# Patient Record
Sex: Female | Born: 1950 | Race: White | Hispanic: No | Marital: Married | Smoking: Never smoker
Health system: Southern US, Community
[De-identification: ages and names within clinical notes are randomized; demographics above are authoritative.]

## PROBLEM LIST (undated history)

## (undated) DIAGNOSIS — E039 Hypothyroidism, unspecified: Secondary | ICD-10-CM

## (undated) DIAGNOSIS — L570 Actinic keratosis: Secondary | ICD-10-CM

## (undated) DIAGNOSIS — E079 Disorder of thyroid, unspecified: Secondary | ICD-10-CM

## (undated) HISTORY — PX: TUBAL LIGATION: SHX77

## (undated) HISTORY — DX: Actinic keratosis: L57.0

---

## 2004-07-03 ENCOUNTER — Ambulatory Visit: Payer: Self-pay

## 2004-07-09 ENCOUNTER — Ambulatory Visit: Payer: Self-pay

## 2004-09-22 ENCOUNTER — Ambulatory Visit: Payer: Self-pay

## 2005-09-09 ENCOUNTER — Ambulatory Visit: Payer: Self-pay

## 2006-11-04 ENCOUNTER — Ambulatory Visit: Payer: Self-pay

## 2007-11-22 ENCOUNTER — Ambulatory Visit: Payer: Self-pay | Admitting: Internal Medicine

## 2008-11-22 ENCOUNTER — Ambulatory Visit: Payer: Self-pay | Admitting: Internal Medicine

## 2009-02-27 ENCOUNTER — Ambulatory Visit: Payer: Self-pay | Admitting: Gastroenterology

## 2009-12-10 ENCOUNTER — Ambulatory Visit: Payer: Self-pay | Admitting: Internal Medicine

## 2009-12-17 ENCOUNTER — Ambulatory Visit: Payer: Self-pay | Admitting: Internal Medicine

## 2011-01-02 ENCOUNTER — Ambulatory Visit: Payer: Self-pay | Admitting: Internal Medicine

## 2012-02-17 ENCOUNTER — Ambulatory Visit: Payer: Self-pay

## 2013-02-17 ENCOUNTER — Ambulatory Visit: Payer: Self-pay | Admitting: Internal Medicine

## 2014-03-12 ENCOUNTER — Ambulatory Visit: Payer: Self-pay

## 2014-09-10 ENCOUNTER — Ambulatory Visit: Payer: Self-pay | Admitting: Gastroenterology

## 2015-04-17 ENCOUNTER — Ambulatory Visit
Admission: RE | Admit: 2015-04-17 | Discharge: 2015-04-17 | Disposition: A | Discharge status: Home / Self Care | Payer: TRICARE For Life (TFL) | Source: Ambulatory Visit | Attending: Nurse Practitioner | Admitting: Nurse Practitioner

## 2015-04-17 ENCOUNTER — Other Ambulatory Visit: Payer: Self-pay | Admitting: Nurse Practitioner

## 2015-04-17 DIAGNOSIS — M79644 Pain in right finger(s): Secondary | ICD-10-CM | POA: Insufficient documentation

## 2015-04-17 DIAGNOSIS — Z1231 Encounter for screening mammogram for malignant neoplasm of breast: Secondary | ICD-10-CM

## 2015-04-17 DIAGNOSIS — M79641 Pain in right hand: Secondary | ICD-10-CM | POA: Insufficient documentation

## 2015-04-22 ENCOUNTER — Ambulatory Visit
Admission: RE | Admit: 2015-04-22 | Discharge: 2015-04-22 | Disposition: A | Discharge status: Home / Self Care | Payer: TRICARE For Life (TFL) | Source: Ambulatory Visit | Attending: Nurse Practitioner | Admitting: Nurse Practitioner

## 2015-04-22 DIAGNOSIS — Z1231 Encounter for screening mammogram for malignant neoplasm of breast: Secondary | ICD-10-CM

## 2015-04-30 ENCOUNTER — Emergency Department (HOSPITAL_COMMUNITY): Payer: TRICARE For Life (TFL)

## 2015-04-30 ENCOUNTER — Emergency Department (HOSPITAL_COMMUNITY)
Admission: EM | Admit: 2015-04-30 | Discharge: 2015-04-30 | Disposition: A | Discharge status: Home / Self Care | Payer: TRICARE For Life (TFL) | Attending: Emergency Medicine | Admitting: Emergency Medicine

## 2015-04-30 ENCOUNTER — Encounter (HOSPITAL_COMMUNITY): Payer: Self-pay | Admitting: *Deleted

## 2015-04-30 DIAGNOSIS — Z79899 Other long term (current) drug therapy: Secondary | ICD-10-CM | POA: Insufficient documentation

## 2015-04-30 DIAGNOSIS — Z87891 Personal history of nicotine dependence: Secondary | ICD-10-CM | POA: Insufficient documentation

## 2015-04-30 DIAGNOSIS — E039 Hypothyroidism, unspecified: Secondary | ICD-10-CM | POA: Diagnosis not present

## 2015-04-30 DIAGNOSIS — R42 Dizziness and giddiness: Secondary | ICD-10-CM | POA: Diagnosis present

## 2015-04-30 DIAGNOSIS — H81399 Other peripheral vertigo, unspecified ear: Secondary | ICD-10-CM | POA: Diagnosis not present

## 2015-04-30 DIAGNOSIS — Z880 Allergy status to penicillin: Secondary | ICD-10-CM | POA: Insufficient documentation

## 2015-04-30 HISTORY — DX: Hypothyroidism, unspecified: E03.9

## 2015-04-30 HISTORY — DX: Disorder of thyroid, unspecified: E07.9

## 2015-04-30 LAB — CBC WITH DIFFERENTIAL/PLATELET
BASOS PCT: 0 %
Basophils Absolute: 0 10*3/uL (ref 0.0–0.1)
EOS ABS: 0 10*3/uL (ref 0.0–0.7)
EOS PCT: 0 %
HCT: 39.3 % (ref 36.0–46.0)
Hemoglobin: 13.3 g/dL (ref 12.0–15.0)
LYMPHS ABS: 0.9 10*3/uL (ref 0.7–4.0)
Lymphocytes Relative: 12 %
MCH: 31.3 pg (ref 26.0–34.0)
MCHC: 33.8 g/dL (ref 30.0–36.0)
MCV: 92.5 fL (ref 78.0–100.0)
MONO ABS: 0.3 10*3/uL (ref 0.1–1.0)
Monocytes Relative: 4 %
NEUTROS ABS: 6.4 10*3/uL (ref 1.7–7.7)
NEUTROS PCT: 84 %
Platelets: 200 10*3/uL (ref 150–400)
RBC: 4.25 MIL/uL (ref 3.87–5.11)
RDW: 12.8 % (ref 11.5–15.5)
WBC: 7.6 10*3/uL (ref 4.0–10.5)

## 2015-04-30 LAB — BASIC METABOLIC PANEL
Anion gap: 8 (ref 5–15)
BUN: 14 mg/dL (ref 6–20)
CO2: 25 mmol/L (ref 22–32)
CREATININE: 0.53 mg/dL (ref 0.44–1.00)
Calcium: 9.1 mg/dL (ref 8.9–10.3)
Chloride: 105 mmol/L (ref 101–111)
GFR calc non Af Amer: >60 mL/min (ref >60)
Glucose, Bld: 104 mg/dL — ABNORMAL HIGH (ref 65–99)
Potassium: 4.2 mmol/L (ref 3.5–5.1)
Sodium: 138 mmol/L (ref 135–145)

## 2015-04-30 MED ORDER — SODIUM CHLORIDE 0.9 % IV BOLUS (SEPSIS)
1000.0000 mL | Freq: Once | INTRAVENOUS | Status: AC
  Administered 2015-04-30: 1000 mL via INTRAVENOUS

## 2015-04-30 MED ORDER — MECLIZINE HCL 25 MG PO TABS: 25.0000 mg | ORAL_TABLET | Freq: Three times a day (TID) | ORAL | Status: DC | PRN

## 2015-04-30 MED ORDER — LORAZEPAM 2 MG/ML IJ SOLN
1.0000 mg | Freq: Once | INTRAMUSCULAR | Status: AC
  Administered 2015-04-30: 1 mg via INTRAVENOUS
  Filled 2015-04-30: qty 1

## 2015-04-30 NOTE — ED Notes (Signed)
Patient is claustrophobic and doesn't even get in elevators because of that.  Discussed with EDP [place].  Will give patient 1 mg of Ativan as she departs for MRI.

## 2015-04-30 NOTE — ED Notes (Signed)
Per EMS - patient comes from home where she lives with her husband with c/o dizziness.  Patient got up to take a shower this morning shortly after 4 am and became so dizzy she had to lay back down.  Patient denies syncope.  Patient states movement of head from side to side makes dizziness worse.  Patient also c/o nausea.  [place] negative.  12 lead unremarkable.  Patient denies hx of vertigo and her only medical issue is hypothyroidism.  Patient received Zofran from EMS and that relieved nausea.  Vitals on scene, 128/70, HR 70, 100% on RA.  CBG 137.

## 2015-04-30 NOTE — ED Notes (Signed)
DIZZINESS  Feeling dizzy since last night Dizziness is new and worse with movement of head Feels like room spins: yes Lightheadedness when stands: no Palpitations or heart racing: no Chest pain:  no Prior dizziness: no Medications tried: no Taking blood thinners: no  Symptoms Hearing Loss: no Ear Pain or fullness: no Ear ringing:  no Nausea or vomiting: yes Vision difficulty or double vision: no Falls: no Head trauma: no Weakness in arm or leg: no Speaking problems: no Headache: intermittent  Patient presents to the ED from home where she lives with her husband with complaints of dizziness since @ 10 pm last night.  Patient states moving her head makes dizziness worse and being still makes it better.  Patient had worked out at [employer] last night and shortly thereafter the dizziness began.  Patient denies headache, but states she has a sense of heaviness in her head.  Patient denies history of vertigo.  On exam, patients lung sounds are clear to auscultation in all lobes with no wheezing or crackles.  Heart sounds WNL, S1/S2 no rub, murmur or gallop.  +3 radial and pedal pulses.  No pre-tibial or pedal edema.  CNIII intact, 64mm, EOMI, no astigmatism.  Upper and lower extremity strength equal bilaterally, no leg or arm drift, face/smile symmetrical, no dysmetria.  Skin warm and dry.

## 2015-04-30 NOTE — ED Provider Notes (Signed)
CSN: [id number]     Arrival date & time 04/30/15  7096 History   First MD Initiated Contact with Patient 04/30/15 [phone]     Chief Complaint  Patient presents with  . Dizziness     (Consider location/radiation/quality/duration/timing/severity/associated sxs/prior Treatment) HPI  64 year old female presents with dizziness and a room spinning sensation that started last night around 10:30 PM. Patient has never had this sensation before. Patient had trouble last night it was unable to sleep due to feeling the sensation whenever she turned her head. There was no sensation of falling one way or the other. It did feel like she is almost about to pass out. There is a heaviness to her head but denies specific headache. No prior history of stroke or vertigo. Besides hypothyroidism has no other medical problems. Patient states she was given Zofran by EMS after her symptoms continued into this morning but now feels completely resolved. The patient denies fevers, diarrhea, or other preceding illness. There is no blurry vision. No ear ringing or pain. No weakness, numbness, or slurred speech.  Past Medical History  Diagnosis Date  . Thyroid disease   . Hypothyroid    Past Surgical History  Procedure Laterality Date  . Tubal ligation     No family history on file. Social History  Substance Use Topics  . Smoking status: Former Smoker    Quit date: 04/29/1972  . Smokeless tobacco: Never Used  . Alcohol Use: No   OB History    No data available     Review of Systems  Constitutional: Negative for fever.  HENT: Negative for ear pain and tinnitus.   Eyes: Negative for visual disturbance.  Gastrointestinal: Positive for nausea and vomiting.  Neurological: Positive for dizziness and headaches. Negative for speech difficulty, weakness and numbness.  All other systems reviewed and are negative.     Allergies  Amoxicillin; Tetracyclines & related; Nubain; and Tetracycline  Home Medications    Prior to Admission medications   Medication Sig Start Date End Date Taking? Authorizing Provider  acetaminophen (TYLENOL) 160 MG/5ML solution Take 240 mg by mouth once.   Yes Historical Provider, MD  Biotin 1 MG CAPS Take 1 tablet by mouth daily.   Yes Historical Provider, MD  calcium citrate-vitamin D (CITRACAL+D) 315-200 MG-UNIT per tablet Take 1 tablet by mouth daily.   Yes Historical Provider, MD  Cyanocobalamin (B-12) 250 MCG TABS Take 250 mcg by mouth daily.   Yes Historical Provider, MD  doxycycline (ORACEA) 40 MG capsule Take 40 mg by mouth every morning.   Yes Historical Provider, MD  folic acid (FOLVITE) 629 MCG tablet Take 1 tablet by mouth daily.   Yes Historical Provider, MD  levothyroxine (SYNTHROID, LEVOTHROID) 50 MCG tablet Take 50 mcg by mouth daily before breakfast.   Yes Historical Provider, MD  Multiple Vitamin (MULTIVITAMIN) tablet Take 1 tablet by mouth daily.   Yes Historical Provider, MD  vitamin C (ASCORBIC ACID) 500 MG tablet Take 500 mg by mouth daily.   Yes Historical Provider, MD   BP 128/72 mmHg  Pulse 61  Temp(Src) 97.7 F (36.5 C) (Oral)  SpO2 98% Physical Exam  Constitutional: She is oriented to person, place, and time. She appears well-developed and well-nourished.  HENT:  Head: Normocephalic and atraumatic.  Right Ear: Tympanic membrane and external ear normal.  Left Ear: Tympanic membrane and external ear normal.  Nose: Nose normal.  Eyes: EOM are normal. Pupils are equal, round, and reactive to light. Right eye exhibits  no discharge. Left eye exhibits no discharge.  Slight nystagmus  Neck: Neck supple.  Cardiovascular: Normal rate, regular rhythm and normal heart sounds.   Pulmonary/Chest: Effort normal and breath sounds normal.  Abdominal: Soft. There is no tenderness.  Neurological: She is alert and oriented to person, place, and time.  CN 2-12 grossly intact. 5/5 strength in all 4 extremities. Normal finger to nose. Normal gait  Skin: Skin  is warm and dry.  Nursing note and vitals reviewed.   ED Course  Procedures (including critical care time) Labs Review Labs Reviewed  BASIC METABOLIC PANEL - Abnormal; Notable for the following:    Glucose, Bld 104 (*)    All other components within normal limits  CBC WITH DIFFERENTIAL/PLATELET    Imaging Review Ct Head Wo Contrast  04/30/2015   CLINICAL DATA:  64 year old female dizziness since 2200 hours yesterday. Initial encounter.  EXAM: CT HEAD WITHOUT CONTRAST  TECHNIQUE: Contiguous axial images were obtained from the base of the skull through the vertex without intravenous contrast.  COMPARISON:  No prior head imaging.  FINDINGS: Visualized paranasal sinuses and mastoids are clear. No acute osseous abnormality identified. Visualized orbits and scalp soft tissues are within normal limits. Bilateral tympanic cavities appear clear.  Cerebral volume is within normal limits for age. No midline shift, mass effect, or evidence of intracranial mass lesion. No ventriculomegaly. No suspicious intracranial vascular hyperdensity. No cortically based acute infarct identified. Mildly asymmetric punctate hyperdensity at the left basal ganglia most likely is inconsequential dystrophic calcification (series 2, image 13). Normal gray-white matter differentiation elsewhere. No acute intracranial hemorrhage identified.  IMPRESSION: Normal for age noncontrast CT appearance of the brain.   Electronically Signed   By: Genevie Ann M.D.   On: 04/30/2015 09:27   Mr Brain Wo Contrast (neuro Protocol)  04/30/2015   CLINICAL DATA:  Vertigo/dizziness beginning last night. Symptoms began shortly after exercising.  EXAM: MRI HEAD WITHOUT CONTRAST  TECHNIQUE: Multiplanar, multiecho pulse sequences of the brain and surrounding structures were obtained without intravenous contrast.  COMPARISON:  Head CT 04/30/2015  FINDINGS: There is no evidence of acute infarct, intracranial hemorrhage, mass, midline shift, or extra-axial  fluid collection. Ventricles and sulci are within normal limits for age. No significant white matter disease is seen.  Orbits are unremarkable. There is mild bilateral maxillary sinus and mild anterior left ethmoid air cell mucosal thickening. Mastoid air cells are clear. Major intracranial vascular flow voids are preserved.  IMPRESSION: Unremarkable appearance of the brain for age.   Electronically Signed   By: Logan Bores M.D.   On: 04/30/2015 11:40   I have personally reviewed and evaluated these images and lab results as part of my medical decision-making.   EKG Interpretation   Date/Time:  Tuesday April 30 2015 08:23:49 EDT Ventricular Rate:  57 PR Interval:  181 QRS Duration: 86 QT Interval:  438 QTC Calculation: 426 R Axis:   59 Text Interpretation:  Normal sinus rhythm Left atrial enlargement Probable  anteroseptal infarct, old Baseline wander in lead(s) II III aVF no acute  ST/T changes No old tracing to compare Confirmed by GOLDSTON  MD, SCOTT  ([id number]) on 04/30/2015 8:42:16 AM      MDM   Final diagnoses:  Peripheral vertigo, unspecified laterality    Patient's vertigo is most likely peripheral in nature. However given her age and new onset at age 89 with significant symptoms and MRI was obtained to rule out stroke. This is negative. No obvious mass. She is  able to walk without getting dizzy or feeling symptoms of falling. At this point, will give a prescription of Antivert if her symptoms return and recommend follow-up with PCP. Discussed strict return per cautions.    Sherwood Gambler, MD 04/30/15 1540

## 2015-04-30 NOTE — ED Notes (Signed)
Patient transported to MRI 

## 2015-04-30 NOTE — ED Notes (Signed)
Patient ambulated without assistance in room and was not dizzy.

## 2016-04-21 ENCOUNTER — Other Ambulatory Visit: Payer: Self-pay | Admitting: Nurse Practitioner

## 2016-04-21 DIAGNOSIS — E2839 Other primary ovarian failure: Secondary | ICD-10-CM

## 2016-04-21 DIAGNOSIS — Z1231 Encounter for screening mammogram for malignant neoplasm of breast: Secondary | ICD-10-CM

## 2016-05-19 ENCOUNTER — Other Ambulatory Visit: Payer: Self-pay | Admitting: Nurse Practitioner

## 2016-05-19 ENCOUNTER — Ambulatory Visit
Admission: RE | Admit: 2016-05-19 | Discharge: 2016-05-19 | Disposition: A | Discharge status: Home / Self Care | Payer: TRICARE For Life (TFL) | Source: Ambulatory Visit | Attending: Nurse Practitioner | Admitting: Nurse Practitioner

## 2016-05-19 DIAGNOSIS — E063 Autoimmune thyroiditis: Secondary | ICD-10-CM | POA: Diagnosis not present

## 2016-05-19 DIAGNOSIS — Z780 Asymptomatic menopausal state: Secondary | ICD-10-CM | POA: Diagnosis not present

## 2016-05-19 DIAGNOSIS — Z1231 Encounter for screening mammogram for malignant neoplasm of breast: Secondary | ICD-10-CM

## 2016-05-19 DIAGNOSIS — E2839 Other primary ovarian failure: Secondary | ICD-10-CM | POA: Insufficient documentation

## 2016-06-15 ENCOUNTER — Ambulatory Visit (INDEPENDENT_AMBULATORY_CARE_PROVIDER_SITE_OTHER): Payer: TRICARE For Life (TFL) | Admitting: Urology

## 2016-06-15 ENCOUNTER — Encounter: Payer: Self-pay | Admitting: Urology

## 2016-06-15 VITALS — BP 127/79 | HR 63 | Ht 65.5 in | Wt 142.1 lb

## 2016-06-15 DIAGNOSIS — N8111 Cystocele, midline: Secondary | ICD-10-CM | POA: Diagnosis not present

## 2016-06-15 DIAGNOSIS — N8110 Cystocele, unspecified: Secondary | ICD-10-CM

## 2016-06-15 DIAGNOSIS — N816 Rectocele: Secondary | ICD-10-CM

## 2016-06-15 LAB — BLADDER SCAN AMB NON-IMAGING: Scan Result: 24

## 2016-06-15 NOTE — Progress Notes (Signed)
06/15/2016 1:56 PM   Stephanie Dillon 09/16/1950 [id number]  Referring provider: Ronnell Freshwater, NP [place], [place] [address]  Chief Complaint  Patient presents with  . New Patient (Initial Visit)    bladder prolapsed     HPI: The patient for a number of years has worsening vaginal bulging sensation. Sometimes she reduces the bladder to void better. Her flow was good. She is continent.  She voids every 3 hours and gets up twice at night.  She has not had a hysterectomy and lives in [place]  She has no neurologic issues. Her bowel movements are normal. She has not had previous bladder surgery. She does not get urinary tract infections commonly. She has not had a kidney stone  She finds that the symptoms are less that she drinks a lot of fluids but worse after she eats. Having said this I did not think she was aggravating the rectocele. She is sexually active  Modifying factors: There are no other modifying factors  Associated signs and symptoms: There are no other associated signs and symptoms Aggravating and relieving factors: There are no other aggravating or relieving factors Severity: Moderate Duration: Persistent   PMH: Past Medical History:  Diagnosis Date  . Hypothyroid   . Thyroid disease     Surgical History: Past Surgical History:  Procedure Laterality Date  . TUBAL LIGATION      Home Medications:    Medication List       Accurate as of 06/15/16  1:56 PM. Always use your most recent med list.          acetaminophen 160 MG/5ML solution Commonly known as:  TYLENOL Take 240 mg by mouth once.   B-12 250 MCG Tabs Take 250 mcg by mouth daily.   Biotin 1 MG Caps Take 1 tablet by mouth daily.   calcium citrate-vitamin D 315-200 MG-UNIT tablet Commonly known as:  CITRACAL+D Take 1 tablet by mouth daily.   folic acid A999333 MCG tablet Commonly known as:  FOLVITE Take 1 tablet by mouth daily.   levothyroxine 50 MCG tablet Commonly  known as:  SYNTHROID, LEVOTHROID Take 50 mcg by mouth daily before breakfast.   meclizine 25 MG tablet Commonly known as:  ANTIVERT Take 1 tablet (25 mg total) by mouth 3 (three) times daily as needed for dizziness.   multivitamin tablet Take 1 tablet by mouth daily.   vitamin C 500 MG tablet Commonly known as:  ASCORBIC ACID Take 500 mg by mouth daily.       Allergies:  Allergies  Allergen Reactions  . Amoxicillin Hives and Rash    Has patient had a PCN reaction causing immediate rash, facial/tongue/throat swelling, SOB or lightheadedness with hypotension: No Has patient had a PCN reaction causing severe rash involving mucus membranes or skin necrosis: No Has patient had a PCN reaction that required hospitalization No Has patient had a PCN reaction occurring within the last 10 years: Yes If all of the above answers are "NO", then may proceed with Cephalosporin use.   . Nalbuphine Hives  . Tetracyclines & Related Nausea And Vomiting  . Nubain [Nalbuphine Hcl]     fever  . Tetracycline Nausea And Vomiting    Family History: Family History  Problem Relation Age of Onset  . Colon cancer Mother   . AAA (abdominal aortic aneurysm) Mother     Social History:  reports that she quit smoking about 44 years ago. She has never used smokeless tobacco. She  reports that she does not drink alcohol or use drugs.  ROS:                                        Physical Exam: BP 127/79   Pulse 63   Ht 5' 5.5" (1.664 m)   Wt 142 lb 1.6 oz (64.5 kg)   BMI 23.29 kg/m   Constitutional:  Alert and oriented, No acute distress. HEENT: [place] AT, moist mucus membranes.  Trachea midline, no masses. Cardiovascular: No clubbing, cyanosis, or edema. Respiratory: Normal respiratory effort, no increased work of breathing. GI: Abdomen is soft, nontender, nondistended, no abdominal masses GU: On Pelvic examination patient had a moderate grade 2 cystocele. Her uterus wouldn't  distend approximate 2 cm. She had a smaller grade 2 rectocele. Unfortunately she could not bear down or cough hard. She had no stress incontinence Skin: No rashes, bruises or suspicious lesions. Lymph: No cervical or inguinal adenopathy. Neurologic: Grossly intact, no focal deficits, moving all 4 extremities. Psychiatric: Normal mood and affect.  Laboratory Data: Lab Results  Component Value Date   WBC 7.6 04/30/2015   HGB 13.3 04/30/2015   HCT 39.3 04/30/2015   MCV 92.5 04/30/2015   PLT 200 04/30/2015    Lab Results  Component Value Date   CREATININE 0.53 04/30/2015    No results found for: PSA  No results found for: TESTOSTERONE  No results found for: HGBA1C  Urinalysis No results found for: COLORURINE, APPEARANCEUR, LABSPEC, PHURINE, GLUCOSEU, HGBUR, BILIRUBINUR, KETONESUR, PROTEINUR, UROBILINOGEN, NITRITE, LEUKOCYTESUR  Pertinent Imaging: None  Assessment & Plan:  Patient has prolapse symptoms and minimal voiding dysfunction. She has mild or nocturia. A picture was drawn. The role of urodynamics was discussed.   If the patient had surgery she would likely best benefit from a hysterectomy with cystocele repair. She may or may not need a vault suspension but likely fixation of the cuff to the ureteral sacral ligaments. She may benefit from a rectocele repair but an intraoperative decision would need to be made. Unfortunately it was harder for her to cough hard today.   1. Cystocele 2. Rectocele 3. Nighttime frequency  There are no diagnoses linked to this encounter.  Return in about 4 weeks (around 07/13/2016) for UDS [place].  Reece Packer, MD  [facility] [address] [place],  [address] [phone]

## 2016-08-04 ENCOUNTER — Ambulatory Visit: Payer: TRICARE For Life (TFL)

## 2016-08-26 ENCOUNTER — Ambulatory Visit: Payer: TRICARE For Life (TFL)

## 2016-10-19 ENCOUNTER — Ambulatory Visit: Payer: TRICARE For Life (TFL)

## 2017-01-14 IMAGING — CR DG FINGER MIDDLE 2+V*R*
1 series · 3 of 3 positions shown · non-contrast
Comparison: None.

CLINICAL DATA: Pain in right middle finger. Started 2-3 months ago
after mowing grass.

EXAM:
RIGHT MIDDLE FINGER 2+V

[Series 1: dg finger middle right · 0.14mm/px · 3 of 3 slices shown]
[im 1/3]
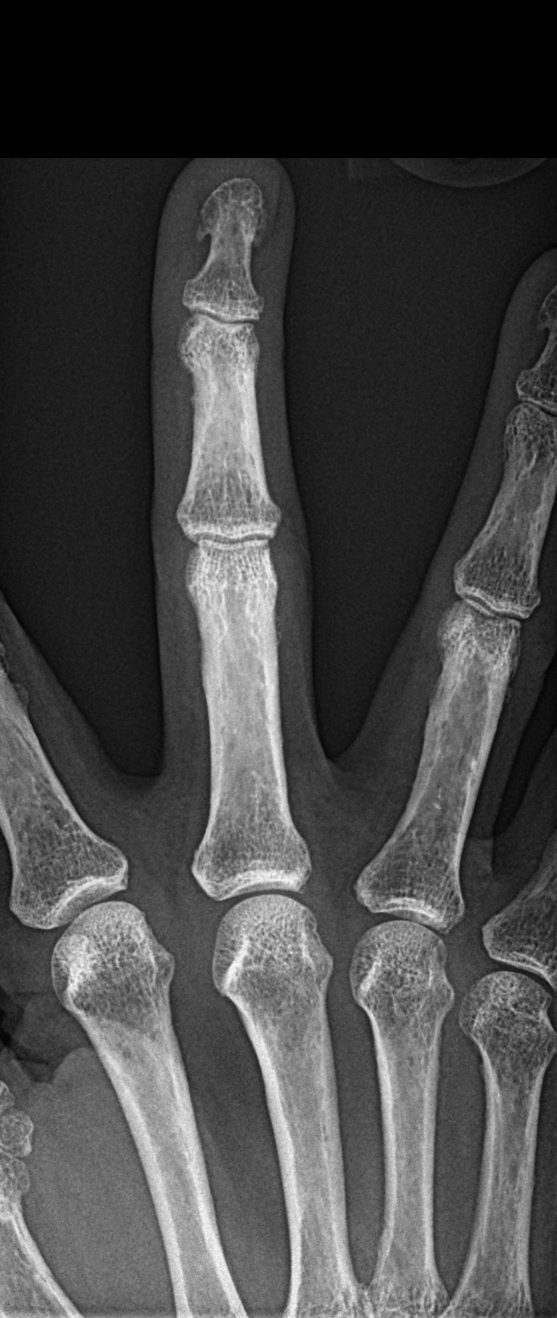
[im 2/3]
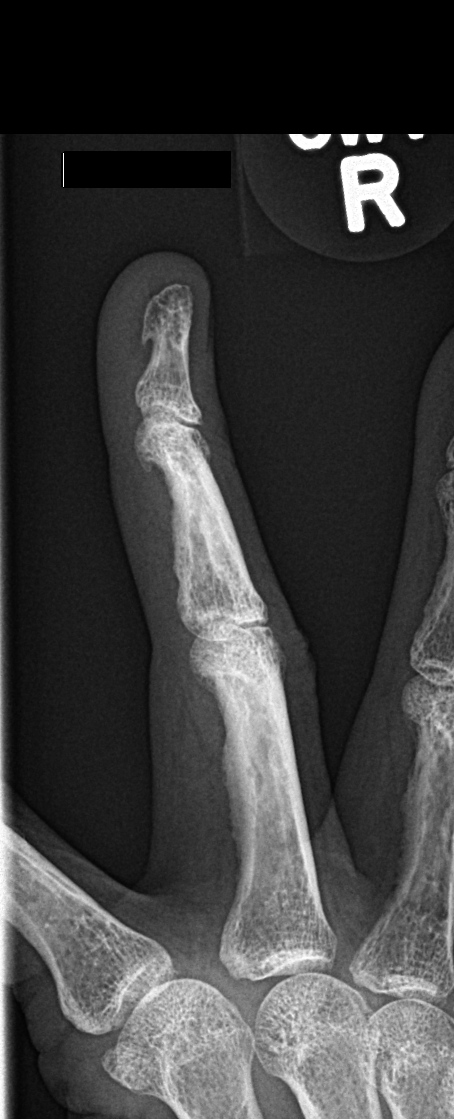
[im 3/3]
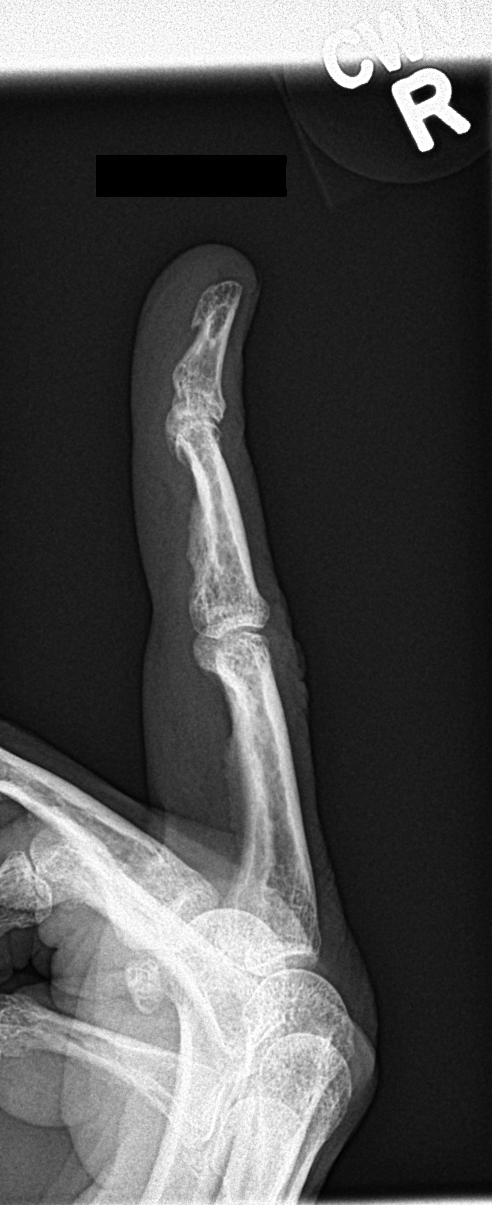

[3 of 3 positions shown; findings below may reference images not displayed]

FINDINGS: Early joint space narrowing and spurring in the DIP joint. Remainder
of the visualized joints are maintained. No acute bony abnormality.
Specifically, no fracture, subluxation, or dislocation. Soft tissues
are intact.
IMPRESSION: No acute bony abnormality.

## 2017-03-30 DIAGNOSIS — H2513 Age-related nuclear cataract, bilateral: Secondary | ICD-10-CM | POA: Diagnosis not present

## 2017-04-27 DIAGNOSIS — N390 Urinary tract infection, site not specified: Secondary | ICD-10-CM | POA: Diagnosis not present

## 2017-04-27 DIAGNOSIS — E039 Hypothyroidism, unspecified: Secondary | ICD-10-CM | POA: Diagnosis not present

## 2017-04-27 DIAGNOSIS — N8110 Cystocele, unspecified: Secondary | ICD-10-CM | POA: Diagnosis not present

## 2017-04-27 DIAGNOSIS — Z0001 Encounter for general adult medical examination with abnormal findings: Secondary | ICD-10-CM | POA: Diagnosis not present

## 2017-04-30 ENCOUNTER — Other Ambulatory Visit: Payer: Self-pay | Admitting: Nurse Practitioner

## 2017-04-30 DIAGNOSIS — Z1231 Encounter for screening mammogram for malignant neoplasm of breast: Secondary | ICD-10-CM

## 2017-05-13 DIAGNOSIS — E063 Autoimmune thyroiditis: Secondary | ICD-10-CM | POA: Diagnosis not present

## 2017-05-26 ENCOUNTER — Ambulatory Visit
Admission: RE | Admit: 2017-05-26 | Discharge: 2017-05-26 | Disposition: A | Discharge status: Home / Self Care | Payer: Medicare Other | Source: Ambulatory Visit | Attending: Nurse Practitioner | Admitting: Nurse Practitioner

## 2017-05-26 DIAGNOSIS — Z1231 Encounter for screening mammogram for malignant neoplasm of breast: Secondary | ICD-10-CM | POA: Diagnosis not present

## 2017-06-07 DIAGNOSIS — E559 Vitamin D deficiency, unspecified: Secondary | ICD-10-CM | POA: Diagnosis not present

## 2017-06-07 DIAGNOSIS — Z0000 Encounter for general adult medical examination without abnormal findings: Secondary | ICD-10-CM | POA: Diagnosis not present

## 2017-06-07 DIAGNOSIS — L570 Actinic keratosis: Secondary | ICD-10-CM | POA: Diagnosis not present

## 2017-06-07 DIAGNOSIS — Z1283 Encounter for screening for malignant neoplasm of skin: Secondary | ICD-10-CM | POA: Diagnosis not present

## 2017-06-07 DIAGNOSIS — L821 Other seborrheic keratosis: Secondary | ICD-10-CM | POA: Diagnosis not present

## 2017-06-07 DIAGNOSIS — T07XXXA Unspecified multiple injuries, initial encounter: Secondary | ICD-10-CM | POA: Diagnosis not present

## 2017-06-07 DIAGNOSIS — D229 Melanocytic nevi, unspecified: Secondary | ICD-10-CM | POA: Diagnosis not present

## 2017-06-07 DIAGNOSIS — I10 Essential (primary) hypertension: Secondary | ICD-10-CM | POA: Diagnosis not present

## 2017-06-07 DIAGNOSIS — L814 Other melanin hyperpigmentation: Secondary | ICD-10-CM | POA: Diagnosis not present

## 2017-06-07 DIAGNOSIS — L578 Other skin changes due to chronic exposure to nonionizing radiation: Secondary | ICD-10-CM | POA: Diagnosis not present

## 2017-06-07 DIAGNOSIS — E039 Hypothyroidism, unspecified: Secondary | ICD-10-CM | POA: Diagnosis not present

## 2017-06-07 DIAGNOSIS — D1800 Hemangioma unspecified site: Secondary | ICD-10-CM | POA: Diagnosis not present

## 2017-06-07 DIAGNOSIS — I8391 Asymptomatic varicose veins of right lower extremity: Secondary | ICD-10-CM | POA: Diagnosis not present

## 2017-06-07 DIAGNOSIS — L918 Other hypertrophic disorders of the skin: Secondary | ICD-10-CM | POA: Diagnosis not present

## 2017-06-16 DIAGNOSIS — N8110 Cystocele, unspecified: Secondary | ICD-10-CM | POA: Diagnosis not present

## 2018-03-30 DIAGNOSIS — H5203 Hypermetropia, bilateral: Secondary | ICD-10-CM | POA: Diagnosis not present

## 2018-03-30 DIAGNOSIS — H25013 Cortical age-related cataract, bilateral: Secondary | ICD-10-CM | POA: Diagnosis not present

## 2018-03-30 DIAGNOSIS — H52223 Regular astigmatism, bilateral: Secondary | ICD-10-CM | POA: Diagnosis not present

## 2018-03-30 DIAGNOSIS — H59811 Chorioretinal scars after surgery for detachment, right eye: Secondary | ICD-10-CM | POA: Diagnosis not present

## 2018-03-30 DIAGNOSIS — H2513 Age-related nuclear cataract, bilateral: Secondary | ICD-10-CM | POA: Diagnosis not present

## 2018-03-30 DIAGNOSIS — H43813 Vitreous degeneration, bilateral: Secondary | ICD-10-CM | POA: Diagnosis not present

## 2018-03-30 DIAGNOSIS — H524 Presbyopia: Secondary | ICD-10-CM | POA: Diagnosis not present

## 2018-05-02 ENCOUNTER — Ambulatory Visit (INDEPENDENT_AMBULATORY_CARE_PROVIDER_SITE_OTHER): Payer: Medicare Other | Admitting: Nurse Practitioner

## 2018-05-02 ENCOUNTER — Encounter: Payer: Self-pay | Admitting: Nurse Practitioner

## 2018-05-02 VITALS — BP 126/82 | HR 71 | Resp 16 | Ht 70.0 in | Wt 141.8 lb

## 2018-05-02 DIAGNOSIS — Z1231 Encounter for screening mammogram for malignant neoplasm of breast: Secondary | ICD-10-CM | POA: Diagnosis not present

## 2018-05-02 DIAGNOSIS — Z1239 Encounter for other screening for malignant neoplasm of breast: Secondary | ICD-10-CM

## 2018-05-02 DIAGNOSIS — E559 Vitamin D deficiency, unspecified: Secondary | ICD-10-CM | POA: Diagnosis not present

## 2018-05-02 DIAGNOSIS — R300 Dysuria: Secondary | ICD-10-CM | POA: Diagnosis not present

## 2018-05-02 DIAGNOSIS — N8110 Cystocele, unspecified: Secondary | ICD-10-CM | POA: Diagnosis not present

## 2018-05-02 DIAGNOSIS — E2839 Other primary ovarian failure: Secondary | ICD-10-CM

## 2018-05-02 DIAGNOSIS — Z0001 Encounter for general adult medical examination with abnormal findings: Secondary | ICD-10-CM | POA: Diagnosis not present

## 2018-05-02 NOTE — Progress Notes (Signed)
[facility] [place], [place] [address]  Internal MEDICINE  Office Visit Note  Patient Name: Stephanie Dillon  [id number]  [id number]  Date of Service: 05/02/2018   Pt is here for routine health maintenance examination   Chief Complaint  Patient presents with  . Annual Exam    medicare annual wellness      Patient continues to have concern about prolapsed bladder. Did see urogynecologist last year. Did some at home, pelvic exercises, to help strengthen pelvic muscles. Has not noted any improvement. Thought that follow up was to be in one year, but it was 6 months. She is unsure if she needs new referral to go back.  She, otherwise feels good with no problems, concerns, or complaints.    Current Medication: Outpatient Encounter Medications as of 05/02/2018  Medication Sig  . Biotin 1 MG CAPS Take 1 tablet by mouth daily.  . calcium citrate-vitamin D (CITRACAL+D) 315-200 MG-UNIT per tablet Take 1 tablet by mouth daily.  . Cyanocobalamin (B-12) 250 MCG TABS Take 250 mcg by mouth daily.  . folic acid (FOLVITE) 712 MCG tablet Take 1 tablet by mouth daily.  [place] Kitchen levothyroxine (SYNTHROID, LEVOTHROID) 50 MCG tablet Take 50 mcg by mouth daily before breakfast.  . Multiple Vitamin (MULTIVITAMIN) tablet Take 1 tablet by mouth daily.  . vitamin C (ASCORBIC ACID) 500 MG tablet Take 500 mg by mouth daily.  . [DISCONTINUED] acetaminophen (TYLENOL) 160 MG/5ML solution Take 240 mg by mouth once.  . [DISCONTINUED] meclizine (ANTIVERT) 25 MG tablet Take 1 tablet (25 mg total) by mouth 3 (three) times daily as needed for dizziness. (Patient not taking: Reported on 06/15/2016)   No facility-administered encounter medications on file as of 05/02/2018.     Surgical History: Past Surgical History:  Procedure Laterality Date  . TUBAL LIGATION      Medical History: Past Medical History:  Diagnosis Date  . Hypothyroid   . Thyroid disease     Family History: Family History   Problem Relation Age of Onset  . Colon cancer Mother   . AAA (abdominal aortic aneurysm) Mother   . Breast cancer Neg Hx       Review of Systems  Constitutional: Negative for activity change, chills, fatigue and unexpected weight change.  HENT: Negative for congestion, postnasal drip, rhinorrhea, sneezing and sore throat.   Eyes: Negative.  Negative for redness.  Respiratory: Negative for cough, chest tightness and shortness of breath.   Cardiovascular: Negative for chest pain and palpitations.  Gastrointestinal: Negative for abdominal pain, constipation, diarrhea, nausea and vomiting.  Endocrine: Negative for cold intolerance, heat intolerance, polydipsia, polyphagia and polyuria.       Patient has chronic hypothyroid which is managed per endocrinology.   Genitourinary: Negative for dysuria and frequency.       Chronic bladder prolapse.   Musculoskeletal: Negative for arthralgias, back pain, joint swelling and neck pain.  Skin: Negative for rash.  Allergic/Immunologic: Negative for environmental allergies.  Neurological: Negative for dizziness, tremors, numbness and headaches.  Hematological: Negative for adenopathy. Does not bruise/bleed easily.  Psychiatric/Behavioral: Negative for behavioral problems (Depression), sleep disturbance and suicidal ideas. The patient is not nervous/anxious.      Today's Vitals   05/02/18 0857  BP: 126/82  Pulse: 71  Resp: 16  SpO2: 98%  Weight: 141 lb 12.8 oz (64.3 kg)  Height: 5\' 10"  (1.778 m)    Physical Exam  Constitutional: She is oriented to person, place, and time. She appears well-developed  and well-nourished. No distress.  HENT:  Head: Normocephalic and atraumatic.  Nose: Nose normal.  Mouth/Throat: Oropharynx is clear and moist. No oropharyngeal exudate.  Eyes: Pupils are equal, round, and reactive to light. Conjunctivae and EOM are normal.  Neck: Normal range of motion. Neck supple. No JVD present. Carotid bruit is not  present. No tracheal deviation present. No thyromegaly present.  Cardiovascular: Normal rate, regular rhythm, normal heart sounds and intact distal pulses. Exam reveals no gallop and no friction rub.  No murmur heard. Pulmonary/Chest: Effort normal and breath sounds normal. No respiratory distress. She has no wheezes. She has no rales. She exhibits no tenderness. Right breast exhibits no inverted nipple, no mass, no nipple discharge, no skin change and no tenderness. Left breast exhibits no inverted nipple, no mass, no nipple discharge, no skin change and no tenderness.  Abdominal: Soft. Bowel sounds are normal. There is no tenderness.  Musculoskeletal: Normal range of motion.  Lymphadenopathy:    She has no cervical adenopathy.  Neurological: She is alert and oriented to person, place, and time. No cranial nerve deficit.  Skin: Skin is warm and dry. Capillary refill takes less than 2 seconds. She is not diaphoretic.  Psychiatric: She has a normal mood and affect. Her behavior is normal. Judgment and thought content normal.  Nursing note and vitals reviewed.  Depression screen PHQ 2/9 05/02/2018  Decreased Interest 0  Down, Depressed, Hopeless 0  PHQ - 2 Score 0    Functional Status Survey: Is the patient deaf or have difficulty hearing?: No Does the patient have difficulty seeing, even when wearing glasses/contacts?: No Does the patient have difficulty concentrating, remembering, or making decisions?: No Does the patient have difficulty walking or climbing stairs?: No Does the patient have difficulty dressing or bathing?: No Does the patient have difficulty doing errands alone such as visiting a doctor's office or shopping?: No  MMSE - [place] Exam 05/02/2018  Orientation to time 5  Orientation to Place 5  Registration 3  Attention/ Calculation 5  Recall 3  Language- name 2 objects 2  Language- repeat 1  Language- follow 3 step command 3  Language- read & follow direction  1  Write a sentence 1  Copy design 1  Total score 30    Fall Risk  05/02/2018  Falls in the past year? No   Assessment/Plan: 1. Encounter for general adult medical examination with abnormal findings Annual health maintenance exam today. - Comprehensive metabolic panel - Lipid panel - CBC, No Differential/Platelet  2. Bladder prolapse, female, acquired New referral for urogynecology for further evaluation and treatment.  - Ambulatory referral to Gynecology  3. Vitamin D deficiency - Vitamin D 1,25 dihydroxy  4. Ovarian failure Bone density test ordered today.  - DG Bone Density; Future  5. Screening for breast cancer - MM DIGITAL SCREENING BILATERAL; Future  6. Dysuria - UA/M w/rflx Culture, Routine  General Counseling: Vieva verbalizes understanding of the findings of todays visit and agrees with plan of treatment. I have discussed any further diagnostic evaluation that may be needed or ordered today. We also reviewed her medications today. she has been encouraged to call the office with any questions or concerns that should arise related to todays visit.    Counseling:  This patient was seen by Leretha Pol FNP Collaboration with Dr Lavera Guise as a part of collaborative care agreement  Orders Placed This Encounter  Procedures  . MM DIGITAL SCREENING BILATERAL  . DG Bone  Density  . UA/M w/rflx Culture, Routine  . Comprehensive metabolic panel  . Lipid panel  . Vitamin D 1,25 dihydroxy  . CBC, No Differential/Platelet  . Ambulatory referral to Gynecology     Time spent: [place], MD  Internal Medicine

## 2018-05-03 ENCOUNTER — Other Ambulatory Visit: Payer: Self-pay | Admitting: Nurse Practitioner

## 2018-05-03 DIAGNOSIS — Z1231 Encounter for screening mammogram for malignant neoplasm of breast: Secondary | ICD-10-CM

## 2018-05-06 LAB — MICROSCOPIC EXAMINATION: Casts: NONE SEEN /lpf

## 2018-05-06 LAB — URINE CULTURE, REFLEX

## 2018-05-06 LAB — UA/M W/RFLX CULTURE, ROUTINE
Bilirubin, UA: NEGATIVE
Glucose, UA: NEGATIVE
Ketones, UA: NEGATIVE
Nitrite, UA: NEGATIVE
PH UA: 7.5 (ref 5.0–7.5)
PROTEIN UA: NEGATIVE
RBC, UA: NEGATIVE
Specific Gravity, UA: 1.005 (ref 1.005–1.030)
Urobilinogen, Ur: 0.2 mg/dL (ref 0.2–1.0)

## 2018-05-13 DIAGNOSIS — E063 Autoimmune thyroiditis: Secondary | ICD-10-CM | POA: Diagnosis not present

## 2018-05-15 ENCOUNTER — Other Ambulatory Visit: Payer: Self-pay | Admitting: Nurse Practitioner

## 2018-05-15 DIAGNOSIS — N390 Urinary tract infection, site not specified: Secondary | ICD-10-CM

## 2018-05-15 MED ORDER — NITROFURANTOIN MONOHYD MACRO 100 MG PO CAPS: 100.0000 mg | ORAL_CAPSULE | Freq: Two times a day (BID) | ORAL | 0 refills | Status: DC

## 2018-05-15 NOTE — Progress Notes (Signed)
Urine sample from physical indicated uti. I have added rx for macrobid which should be taken twice daily for 10 days. I have sent this to [facility] in [place].

## 2018-05-17 ENCOUNTER — Telehealth: Payer: Self-pay

## 2018-05-17 NOTE — Telephone Encounter (Signed)
Pt advised urine did showed UTI we send macrobid to her phar

## 2018-05-19 ENCOUNTER — Other Ambulatory Visit: Payer: Self-pay | Admitting: Nurse Practitioner

## 2018-05-19 DIAGNOSIS — R5381 Other malaise: Secondary | ICD-10-CM | POA: Diagnosis not present

## 2018-05-19 DIAGNOSIS — E756 Lipid storage disorder, unspecified: Secondary | ICD-10-CM | POA: Diagnosis not present

## 2018-05-19 DIAGNOSIS — R5383 Other fatigue: Secondary | ICD-10-CM | POA: Diagnosis not present

## 2018-05-19 DIAGNOSIS — Z0001 Encounter for general adult medical examination with abnormal findings: Secondary | ICD-10-CM | POA: Diagnosis not present

## 2018-05-19 DIAGNOSIS — E559 Vitamin D deficiency, unspecified: Secondary | ICD-10-CM | POA: Diagnosis not present

## 2018-05-20 LAB — COMPREHENSIVE METABOLIC PANEL
A/G RATIO: 1.8 (ref 1.2–2.2)
ALK PHOS: 63 IU/L (ref 39–117)
ALT: 22 IU/L (ref 0–32)
AST: 25 IU/L (ref 0–40)
Albumin: 4.4 g/dL (ref 3.6–4.8)
BILIRUBIN TOTAL: 1.5 mg/dL — AB (ref 0.0–1.2)
BUN / CREAT RATIO: 17 (ref 12–28)
BUN: 10 mg/dL (ref 8–27)
CHLORIDE: 97 mmol/L (ref 96–106)
CO2: 24 mmol/L (ref 20–29)
Calcium: 9 mg/dL (ref 8.7–10.3)
Creatinine, Ser: 0.58 mg/dL (ref 0.57–1.00)
GFR calc Af Amer: 111 mL/min/{1.73_m2} (ref >59)
GFR calc non Af Amer: 96 mL/min/{1.73_m2} (ref >59)
GLOBULIN, TOTAL: 2.5 g/dL (ref 1.5–4.5)
Glucose: 84 mg/dL (ref 65–99)
Potassium: 4.1 mmol/L (ref 3.5–5.2)
Sodium: 136 mmol/L (ref 134–144)
Total Protein: 6.9 g/dL (ref 6.0–8.5)

## 2018-05-20 LAB — CBC
HEMOGLOBIN: 13 g/dL (ref 11.1–15.9)
Hematocrit: 39.3 % (ref 34.0–46.6)
MCH: 30.6 pg (ref 26.6–33.0)
MCHC: 33.1 g/dL (ref 31.5–35.7)
MCV: 93 fL (ref 79–97)
Platelets: 222 10*3/uL (ref 150–450)
RBC: 4.25 x10E6/uL (ref 3.77–5.28)
RDW: 12.5 % (ref 12.3–15.4)
WBC: 5.7 10*3/uL (ref 3.4–10.8)

## 2018-05-20 LAB — LIPID PANEL W/O CHOL/HDL RATIO
Cholesterol, Total: 222 mg/dL — ABNORMAL HIGH (ref 100–199)
HDL: 86 mg/dL (ref >39)
LDL Calculated: 122 mg/dL — ABNORMAL HIGH (ref 0–99)
TRIGLYCERIDES: 72 mg/dL (ref 0–149)
VLDL Cholesterol Cal: 14 mg/dL (ref 5–40)

## 2018-05-20 LAB — VITAMIN D 25 HYDROXY (VIT D DEFICIENCY, FRACTURES): VIT D 25 HYDROXY: 36.1 ng/mL (ref 30.0–100.0)

## 2018-05-27 ENCOUNTER — Telehealth: Payer: Self-pay

## 2018-05-27 NOTE — Telephone Encounter (Signed)
Pt advised for labs and mailed prudent diet

## 2018-06-01 ENCOUNTER — Ambulatory Visit
Admission: RE | Admit: 2018-06-01 | Discharge: 2018-06-01 | Disposition: A | Discharge status: Home / Self Care | Payer: Medicare Other | Source: Ambulatory Visit | Attending: Nurse Practitioner | Admitting: Nurse Practitioner

## 2018-06-01 ENCOUNTER — Telehealth: Payer: Self-pay

## 2018-06-01 DIAGNOSIS — Z13820 Encounter for screening for osteoporosis: Secondary | ICD-10-CM | POA: Diagnosis not present

## 2018-06-01 DIAGNOSIS — E2839 Other primary ovarian failure: Secondary | ICD-10-CM | POA: Insufficient documentation

## 2018-06-01 DIAGNOSIS — Z1231 Encounter for screening mammogram for malignant neoplasm of breast: Secondary | ICD-10-CM

## 2018-06-01 DIAGNOSIS — Z780 Asymptomatic menopausal state: Secondary | ICD-10-CM | POA: Diagnosis not present

## 2018-06-01 NOTE — Telephone Encounter (Signed)
lmom to call us back

## 2018-06-03 ENCOUNTER — Telehealth: Payer: Self-pay

## 2018-06-03 NOTE — Telephone Encounter (Signed)
lmom BMD  Is normal

## 2018-06-07 DIAGNOSIS — I8393 Asymptomatic varicose veins of bilateral lower extremities: Secondary | ICD-10-CM | POA: Diagnosis not present

## 2018-06-07 DIAGNOSIS — Z1283 Encounter for screening for malignant neoplasm of skin: Secondary | ICD-10-CM | POA: Diagnosis not present

## 2018-06-07 DIAGNOSIS — I781 Nevus, non-neoplastic: Secondary | ICD-10-CM | POA: Diagnosis not present

## 2018-06-07 DIAGNOSIS — D1800 Hemangioma unspecified site: Secondary | ICD-10-CM | POA: Diagnosis not present

## 2018-06-07 DIAGNOSIS — L812 Freckles: Secondary | ICD-10-CM | POA: Diagnosis not present

## 2018-06-07 DIAGNOSIS — L578 Other skin changes due to chronic exposure to nonionizing radiation: Secondary | ICD-10-CM | POA: Diagnosis not present

## 2018-06-07 DIAGNOSIS — D229 Melanocytic nevi, unspecified: Secondary | ICD-10-CM | POA: Diagnosis not present

## 2018-06-07 DIAGNOSIS — L821 Other seborrheic keratosis: Secondary | ICD-10-CM | POA: Diagnosis not present

## 2018-06-07 DIAGNOSIS — L570 Actinic keratosis: Secondary | ICD-10-CM | POA: Diagnosis not present

## 2018-08-19 DIAGNOSIS — N8110 Cystocele, unspecified: Secondary | ICD-10-CM | POA: Diagnosis not present

## 2018-08-19 DIAGNOSIS — R300 Dysuria: Secondary | ICD-10-CM | POA: Diagnosis not present

## 2018-09-07 ENCOUNTER — Telehealth: Payer: Self-pay

## 2018-09-07 NOTE — Telephone Encounter (Signed)
Pt came in with concerns about [facility] bill from 10/17 called and spoke with [facility] rep, Vevelyn Royals, resubmitted diagnosis codes and the claim was being filed again per [place]. Informed pt to disregard any bills pertaining to this bill that was resubmitted,

## 2019-05-01 ENCOUNTER — Other Ambulatory Visit: Payer: Self-pay | Admitting: Nurse Practitioner

## 2019-05-01 DIAGNOSIS — Z1231 Encounter for screening mammogram for malignant neoplasm of breast: Secondary | ICD-10-CM

## 2019-05-04 ENCOUNTER — Ambulatory Visit: Payer: Self-pay | Admitting: Nurse Practitioner

## 2019-05-17 DIAGNOSIS — E063 Autoimmune thyroiditis: Secondary | ICD-10-CM | POA: Diagnosis not present

## 2019-05-29 DIAGNOSIS — E063 Autoimmune thyroiditis: Secondary | ICD-10-CM | POA: Diagnosis not present

## 2019-05-29 DIAGNOSIS — Z23 Encounter for immunization: Secondary | ICD-10-CM | POA: Diagnosis not present

## 2019-06-07 ENCOUNTER — Ambulatory Visit
Admission: RE | Admit: 2019-06-07 | Discharge: 2019-06-07 | Disposition: A | Discharge status: Home / Self Care | Payer: Medicare Other | Source: Ambulatory Visit | Attending: Nurse Practitioner | Admitting: Nurse Practitioner

## 2019-06-07 ENCOUNTER — Other Ambulatory Visit: Payer: Self-pay

## 2019-06-07 ENCOUNTER — Ambulatory Visit (INDEPENDENT_AMBULATORY_CARE_PROVIDER_SITE_OTHER): Payer: Medicare Other | Admitting: Nurse Practitioner

## 2019-06-07 ENCOUNTER — Encounter: Payer: Self-pay | Admitting: Nurse Practitioner

## 2019-06-07 VITALS — BP 124/80 | HR 65 | Resp 16 | Ht 70.0 in | Wt 143.8 lb

## 2019-06-07 DIAGNOSIS — R300 Dysuria: Secondary | ICD-10-CM

## 2019-06-07 DIAGNOSIS — E039 Hypothyroidism, unspecified: Secondary | ICD-10-CM

## 2019-06-07 DIAGNOSIS — Z0001 Encounter for general adult medical examination with abnormal findings: Secondary | ICD-10-CM | POA: Insufficient documentation

## 2019-06-07 DIAGNOSIS — E559 Vitamin D deficiency, unspecified: Secondary | ICD-10-CM | POA: Diagnosis not present

## 2019-06-07 DIAGNOSIS — Z1231 Encounter for screening mammogram for malignant neoplasm of breast: Secondary | ICD-10-CM | POA: Diagnosis not present

## 2019-06-07 DIAGNOSIS — Z1211 Encounter for screening for malignant neoplasm of colon: Secondary | ICD-10-CM | POA: Insufficient documentation

## 2019-06-07 DIAGNOSIS — N8110 Cystocele, unspecified: Secondary | ICD-10-CM | POA: Diagnosis not present

## 2019-06-07 NOTE — Progress Notes (Signed)
[facility] [place], [place] [address]  Internal MEDICINE  Office Visit Note  Patient Name: Stephanie Dillon  [id number]  [id number]  Date of Service: 06/07/2019   Pt is here for routine health maintenance examination   Chief Complaint  Patient presents with  . Annual Exam  . Hypothyroidism  . Quality Metric Gaps    pna vacc  . Bladder Prolapse    has been causing issues since COVID     The patient is here for health maintenance exam. She states that she is doing well. Due for routine, fasting labs. Scheduled for mammogram later today. Had flu shot at [facility] a few weeks ago. She does not wish to get pneumonia vaccine serites at this time.    Current Medication: Outpatient Encounter Medications as of 06/07/2019  Medication Sig  . Biotin 1 MG CAPS Take 1 tablet by mouth daily.  . calcium citrate-vitamin D (CITRACAL+D) 315-200 MG-UNIT per tablet Take 1 tablet by mouth daily.  . Cyanocobalamin (B-12) 250 MCG TABS Take 250 mcg by mouth daily.  . folic acid (FOLVITE) A999333 MCG tablet Take 1 tablet by mouth daily.  Marland Kitchen levothyroxine (SYNTHROID, LEVOTHROID) 50 MCG tablet Take 50 mcg by mouth daily before breakfast.  . Multiple Vitamin (MULTIVITAMIN) tablet Take 1 tablet by mouth daily.  . vitamin C (ASCORBIC ACID) 500 MG tablet Take 500 mg by mouth daily.  . [DISCONTINUED] nitrofurantoin, macrocrystal-monohydrate, (MACROBID) 100 MG capsule Take 1 capsule (100 mg total) by mouth 2 (two) times daily. (Patient not taking: Reported on 06/07/2019)   No facility-administered encounter medications on file as of 06/07/2019.     Surgical History: Past Surgical History:  Procedure Laterality Date  . TUBAL LIGATION      Medical History: Past Medical History:  Diagnosis Date  . Hypothyroid   . Thyroid disease     Family History: Family History  Problem Relation Age of Onset  . Colon cancer Mother   . AAA (abdominal aortic aneurysm) Mother   . Breast  cancer Neg Hx       Review of Systems  Constitutional: Negative for activity change, chills, fatigue and unexpected weight change.  HENT: Negative for congestion, postnasal drip, rhinorrhea, sneezing and sore throat.   Respiratory: Negative for cough, chest tightness and shortness of breath.   Cardiovascular: Negative for chest pain and palpitations.  Gastrointestinal: Negative for abdominal pain, constipation, diarrhea, nausea and vomiting.  Endocrine: Negative for cold intolerance, heat intolerance, polydipsia and polyuria.       Patient has chronic hypothyroid which is managed per endocrinology.   Genitourinary: Negative for dysuria and frequency.       Chronic bladder prolapse. Did see urologist for this last year. Feels as though this has gotten worse since onset of COVID 19 pandemic.   Musculoskeletal: Negative for arthralgias, back pain, joint swelling and neck pain.  Skin: Negative for rash.  Allergic/Immunologic: Negative for environmental allergies.  Neurological: Negative for dizziness, tremors, numbness and headaches.  Hematological: Negative for adenopathy. Does not bruise/bleed easily.  Psychiatric/Behavioral: Negative for behavioral problems (Depression), sleep disturbance and suicidal ideas. The patient is not nervous/anxious.      Today's Vitals   06/07/19 0827  BP: 124/80  Pulse: 65  Resp: 16  SpO2: 99%  Weight: 143 lb 12.8 oz (65.2 kg)  Height: 5\' 10"  (1.778 m)   Body mass index is 20.63 kg/m.  Physical Exam Vitals signs and nursing note reviewed.  Constitutional:  General: She is not in acute distress.    Appearance: Normal appearance. She is well-developed. She is not diaphoretic.  HENT:     Head: Normocephalic and atraumatic.     Nose: Nose normal.     Mouth/Throat:     Pharynx: No oropharyngeal exudate.  Eyes:     Conjunctiva/sclera: Conjunctivae normal.     Pupils: Pupils are equal, round, and reactive to light.  Neck:     Musculoskeletal:  Normal range of motion and neck supple.     Thyroid: No thyromegaly.     Vascular: No carotid bruit or JVD.     Trachea: No tracheal deviation.  Cardiovascular:     Rate and Rhythm: Normal rate and regular rhythm.     Heart sounds: Normal heart sounds. No murmur. No friction rub. No gallop.   Pulmonary:     Effort: Pulmonary effort is normal. No respiratory distress.     Breath sounds: Normal breath sounds. No wheezing or rales.  Chest:     Chest wall: No tenderness.     Breasts:        Right: Normal. No inverted nipple, mass, nipple discharge, skin change or tenderness.        Left: Normal. No inverted nipple, mass, nipple discharge, skin change or tenderness.  Abdominal:     General: Bowel sounds are normal.     Palpations: Abdomen is soft.     Tenderness: There is no abdominal tenderness.  Musculoskeletal: Normal range of motion.  Lymphadenopathy:     Cervical: No cervical adenopathy.     Upper Body:     Right upper body: No supraclavicular or axillary adenopathy.     Left upper body: No supraclavicular adenopathy.  Skin:    General: Skin is warm and dry.     Capillary Refill: Capillary refill takes less than 2 seconds.  Neurological:     Mental Status: She is alert and oriented to person, place, and time.     Cranial Nerves: No cranial nerve deficit.  Psychiatric:        Mood and Affect: Mood normal.        Behavior: Behavior normal.        Thought Content: Thought content normal.        Judgment: Judgment normal.    Depression screen [facility] 2/9 06/07/2019 05/02/2018  Decreased Interest 0 0  Down, Depressed, Hopeless 0 0  PHQ - 2 Score 0 0    Functional Status Survey: Is the patient deaf or have difficulty hearing?: No Does the patient have difficulty seeing, even when wearing glasses/contacts?: No Does the patient have difficulty concentrating, remembering, or making decisions?: No Does the patient have difficulty walking or climbing stairs?: No Does the patient have  difficulty dressing or bathing?: No Does the patient have difficulty doing errands alone such as visiting a doctor's office or shopping?: No  MMSE - [place] Exam 05/02/2018  Orientation to time 5  Orientation to Place 5  Registration 3  Attention/ Calculation 5  Recall 3  Language- name 2 objects 2  Language- repeat 1  Language- follow 3 step command 3  Language- read & follow direction 1  Write a sentence 1  Copy design 1  Total score 30    Fall Risk  06/07/2019 05/02/2018  Falls in the past year? 0 No    Assessment/Plan: 1. Encounter for general adult medical examination with abnormal findings Annual health maintenance exam today.   2. Acquired hypothyroidism  Followed per endocrinology   3. Bladder prolapse, female, acquired Patient reports worsening. Will return to urology as needed.   4. Vitamin D deficiency Check vitamin d level and treat as indicated  5. Screening for colon cancer Refer to GI for screening colonoscopy  - Ambulatory referral to Gastroenterology  6. Dysuria - UA/M w/rflx Culture, Routine  General Counseling: [place] verbalizes understanding of the findings of todays visit and agrees with plan of treatment. I have discussed any further diagnostic evaluation that may be needed or ordered today. We also reviewed her medications today. she has been encouraged to call the office with any questions or concerns that should arise related to todays visit.    Counseling:  This patient was seen by Leretha Pol FNP Collaboration with Dr Lavera Guise as a part of collaborative care agreement  Orders Placed This Encounter  Procedures  . UA/M w/rflx Culture, Routine  . Ambulatory referral to Gastroenterology      Time spent: [place], MD  Internal Medicine

## 2019-06-08 NOTE — Progress Notes (Signed)
Negative mammogram

## 2019-06-10 LAB — URINE CULTURE, REFLEX

## 2019-06-10 LAB — UA/M W/RFLX CULTURE, ROUTINE
Bilirubin, UA: NEGATIVE
Glucose, UA: NEGATIVE
Ketones, UA: NEGATIVE
Nitrite, UA: NEGATIVE
Protein,UA: NEGATIVE
RBC, UA: NEGATIVE
Specific Gravity, UA: 1.007 (ref 1.005–1.030)
Urobilinogen, Ur: 0.2 mg/dL (ref 0.2–1.0)
pH, UA: 7.5 (ref 5.0–7.5)

## 2019-06-10 LAB — MICROSCOPIC EXAMINATION
Bacteria, UA: NONE SEEN
Casts: NONE SEEN /lpf

## 2019-06-12 ENCOUNTER — Other Ambulatory Visit: Payer: Self-pay | Admitting: Nurse Practitioner

## 2019-06-12 DIAGNOSIS — N390 Urinary tract infection, site not specified: Secondary | ICD-10-CM

## 2019-06-12 MED ORDER — NITROFURANTOIN MONOHYD MACRO 100 MG PO CAPS: 100.0000 mg | ORAL_CAPSULE | Freq: Two times a day (BID) | ORAL | 0 refills | Status: DC

## 2019-06-12 NOTE — Progress Notes (Signed)
Pkease letthe patient know that uti present on u/a results from physical. Added macrobid 100mg  bid for 7 days. Sent to her pharmacy. Thanks.

## 2019-06-12 NOTE — Progress Notes (Signed)
Lmom to call us back 

## 2019-06-12 NOTE — Progress Notes (Signed)
uti present on u/a results from physical. Added macrobid 100mg  bid for 7 days. Sent to her pharmacy.

## 2019-06-13 NOTE — Progress Notes (Signed)
Patient has been advised. Stephanie Dillon 

## 2019-06-20 DIAGNOSIS — H59811 Chorioretinal scars after surgery for detachment, right eye: Secondary | ICD-10-CM | POA: Diagnosis not present

## 2019-06-23 ENCOUNTER — Other Ambulatory Visit
Admission: RE | Admit: 2019-06-23 | Discharge: 2019-06-23 | Disposition: A | Discharge status: Home / Self Care | Payer: Medicare Other | Attending: Nurse Practitioner | Admitting: Nurse Practitioner

## 2019-06-23 DIAGNOSIS — Z0000 Encounter for general adult medical examination without abnormal findings: Secondary | ICD-10-CM | POA: Insufficient documentation

## 2019-06-23 DIAGNOSIS — E559 Vitamin D deficiency, unspecified: Secondary | ICD-10-CM | POA: Diagnosis not present

## 2019-06-23 LAB — COMPREHENSIVE METABOLIC PANEL
ALT: 22 U/L (ref 0–44)
AST: 23 U/L (ref 15–41)
Albumin: 4.1 g/dL (ref 3.5–5.0)
Alkaline Phosphatase: 52 U/L (ref 38–126)
Anion gap: 10 (ref 5–15)
BUN: 8 mg/dL (ref 8–23)
CO2: 26 mmol/L (ref 22–32)
Calcium: 9 mg/dL (ref 8.9–10.3)
Chloride: 100 mmol/L (ref 98–111)
Creatinine, Ser: 0.5 mg/dL (ref 0.44–1.00)
GFR calc Af Amer: >60 mL/min (ref >60)
GFR calc non Af Amer: >60 mL/min (ref >60)
Glucose, Bld: 97 mg/dL (ref 70–99)
Potassium: 4 mmol/L (ref 3.5–5.1)
Sodium: 136 mmol/L (ref 135–145)
Total Bilirubin: 1 mg/dL (ref 0.3–1.2)
Total Protein: 7.1 g/dL (ref 6.5–8.1)

## 2019-06-23 LAB — CBC
HCT: 36.1 % (ref 36.0–46.0)
Hemoglobin: 12.7 g/dL (ref 12.0–15.0)
MCH: 30.9 pg (ref 26.0–34.0)
MCHC: 35.2 g/dL (ref 30.0–36.0)
MCV: 87.8 fL (ref 80.0–100.0)
Platelets: 235 10*3/uL (ref 150–400)
RBC: 4.11 MIL/uL (ref 3.87–5.11)
RDW: 12.3 % (ref 11.5–15.5)
WBC: 4.9 10*3/uL (ref 4.0–10.5)
nRBC: 0 % (ref 0.0–0.2)

## 2019-06-23 LAB — LIPID PANEL
Cholesterol: 216 mg/dL — ABNORMAL HIGH (ref 0–200)
HDL: 86 mg/dL (ref >40)
LDL Cholesterol: 122 mg/dL — ABNORMAL HIGH (ref 0–99)
Total CHOL/HDL Ratio: 2.5 RATIO
Triglycerides: 40 mg/dL (ref <150)
VLDL: 8 mg/dL (ref 0–40)

## 2019-06-23 LAB — VITAMIN D 25 HYDROXY (VIT D DEFICIENCY, FRACTURES): Vit D, 25-Hydroxy: 36.1 ng/mL (ref 30–100)

## 2019-06-26 NOTE — Progress Notes (Signed)
Labs so far look good. Mildly elevated LDL and total cholesterol.

## 2019-07-10 ENCOUNTER — Telehealth: Payer: Self-pay

## 2019-07-10 NOTE — Telephone Encounter (Signed)
Can you let the patient know that there was very mild elevation of bad and total cholesterol. She should limit intake of fried and fatty foods in her diet and increase physical activity. All other labs are good. Thanks.

## 2019-07-10 NOTE — Telephone Encounter (Signed)
Hey there is a note left on her labs, can u or pt not see it, let me know

## 2019-07-11 NOTE — Telephone Encounter (Signed)
lmom to call us back

## 2019-07-11 NOTE — Telephone Encounter (Signed)
Patients husband has been advised of pt lab results. Beth

## 2019-09-12 DIAGNOSIS — L821 Other seborrheic keratosis: Secondary | ICD-10-CM | POA: Diagnosis not present

## 2019-09-12 DIAGNOSIS — L578 Other skin changes due to chronic exposure to nonionizing radiation: Secondary | ICD-10-CM | POA: Diagnosis not present

## 2019-09-12 DIAGNOSIS — L219 Seborrheic dermatitis, unspecified: Secondary | ICD-10-CM | POA: Diagnosis not present

## 2019-09-12 DIAGNOSIS — D1801 Hemangioma of skin and subcutaneous tissue: Secondary | ICD-10-CM | POA: Diagnosis not present

## 2019-09-12 DIAGNOSIS — L814 Other melanin hyperpigmentation: Secondary | ICD-10-CM | POA: Diagnosis not present

## 2019-09-12 DIAGNOSIS — D692 Other nonthrombocytopenic purpura: Secondary | ICD-10-CM | POA: Diagnosis not present

## 2019-09-12 DIAGNOSIS — Z1283 Encounter for screening for malignant neoplasm of skin: Secondary | ICD-10-CM | POA: Diagnosis not present

## 2019-09-12 DIAGNOSIS — D2262 Melanocytic nevi of left upper limb, including shoulder: Secondary | ICD-10-CM | POA: Diagnosis not present

## 2019-10-04 DIAGNOSIS — Z01812 Encounter for preprocedural laboratory examination: Secondary | ICD-10-CM | POA: Diagnosis not present

## 2019-10-04 DIAGNOSIS — Z800 Family history of malignant neoplasm of digestive organs: Secondary | ICD-10-CM | POA: Diagnosis not present

## 2019-11-09 DIAGNOSIS — Z01812 Encounter for preprocedural laboratory examination: Secondary | ICD-10-CM | POA: Diagnosis not present

## 2019-11-14 DIAGNOSIS — Z1211 Encounter for screening for malignant neoplasm of colon: Secondary | ICD-10-CM | POA: Diagnosis not present

## 2019-11-14 DIAGNOSIS — K640 First degree hemorrhoids: Secondary | ICD-10-CM | POA: Diagnosis not present

## 2019-11-14 DIAGNOSIS — K641 Second degree hemorrhoids: Secondary | ICD-10-CM | POA: Diagnosis not present

## 2019-11-14 DIAGNOSIS — Z800 Family history of malignant neoplasm of digestive organs: Secondary | ICD-10-CM | POA: Diagnosis not present

## 2019-11-14 DIAGNOSIS — D120 Benign neoplasm of cecum: Secondary | ICD-10-CM | POA: Diagnosis not present

## 2019-11-14 DIAGNOSIS — K5730 Diverticulosis of large intestine without perforation or abscess without bleeding: Secondary | ICD-10-CM | POA: Diagnosis not present

## 2019-11-14 DIAGNOSIS — K635 Polyp of colon: Secondary | ICD-10-CM | POA: Diagnosis not present

## 2020-02-20 ENCOUNTER — Encounter: Payer: Self-pay | Admitting: Adult Health

## 2020-02-20 ENCOUNTER — Ambulatory Visit (INDEPENDENT_AMBULATORY_CARE_PROVIDER_SITE_OTHER): Payer: Medicare Other | Admitting: Adult Health

## 2020-02-20 ENCOUNTER — Other Ambulatory Visit: Payer: Self-pay

## 2020-02-20 VITALS — Temp 97.8°F | Resp 16 | Ht 70.0 in | Wt 142.0 lb

## 2020-02-20 DIAGNOSIS — J988 Other specified respiratory disorders: Secondary | ICD-10-CM

## 2020-02-20 DIAGNOSIS — R05 Cough: Secondary | ICD-10-CM | POA: Diagnosis not present

## 2020-02-20 DIAGNOSIS — R059 Cough, unspecified: Secondary | ICD-10-CM

## 2020-02-20 MED ORDER — GUAIFENESIN-CODEINE 100-10 MG/5ML PO SYRP: 5.0000 mL | ORAL_SOLUTION | Freq: Three times a day (TID) | ORAL | 0 refills | Status: DC | PRN

## 2020-02-20 MED ORDER — AZITHROMYCIN 250 MG PO TABS: ORAL_TABLET | ORAL | 0 refills | Status: DC

## 2020-02-20 NOTE — Progress Notes (Signed)
[facility] [place], [place] [address]  Internal MEDICINE  Telephone Visit  Patient Name: Stephanie Dillon  [id number]  [id number]  Date of Service: 02/20/2020  I connected with the patient at 930 by telephone and verified the patients identity using two identifiers.   I discussed the limitations, risks, security and privacy concerns of performing an evaluation and management service by telephone and the availability of in person appointments. I also discussed with the patient that there may be a patient responsible charge related to the service.  The patient expressed understanding and agrees to proceed.    Chief Complaint  Patient presents with  . Telephone Assessment    low grade fever, coughing, headache, sore throat since last wednesday   . Telephone Screen    covid test negative     HPI  Pt is seen via telephone.  She reports her son and grandson have had low grade fever, coughing, sore throat, and chest congestion.  She is now having symptoms, and started coughing for about a week. She had a rapid covid test Sunday, and it was negative.    Current Medication: Outpatient Encounter Medications as of 02/20/2020  Medication Sig  . Biotin 1 MG CAPS Take 1 tablet by mouth daily.  . calcium citrate-vitamin D (CITRACAL+D) 315-200 MG-UNIT per tablet Take 1 tablet by mouth daily.  . Cyanocobalamin (B-12) 250 MCG TABS Take 250 mcg by mouth daily.  . folic acid (FOLVITE) 494 MCG tablet Take 1 tablet by mouth daily.  Marland Kitchen levothyroxine (SYNTHROID, LEVOTHROID) 50 MCG tablet Take 50 mcg by mouth daily before breakfast.  . Multiple Vitamin (MULTIVITAMIN) tablet Take 1 tablet by mouth daily.  . nitrofurantoin, macrocrystal-monohydrate, (MACROBID) 100 MG capsule Take 1 capsule (100 mg total) by mouth 2 (two) times daily.  . vitamin C (ASCORBIC ACID) 500 MG tablet Take 500 mg by mouth daily.  Marland Kitchen azithromycin (ZITHROMAX) 250 MG tablet Use as directed  . guaiFENesin-codeine  (ROBITUSSIN AC) 100-10 MG/5ML syrup Take 5 mLs by mouth 3 (three) times daily as needed for cough.   No facility-administered encounter medications on file as of 02/20/2020.    Surgical History: Past Surgical History:  Procedure Laterality Date  . TUBAL LIGATION      Medical History: Past Medical History:  Diagnosis Date  . Hypothyroid   . Thyroid disease     Family History: Family History  Problem Relation Age of Onset  . Colon cancer Mother   . AAA (abdominal aortic aneurysm) Mother   . Breast cancer Neg Hx     Social History   Socioeconomic History  . Marital status: Married    Spouse name: Not on file  . Number of children: Not on file  . Years of education: Not on file  . Highest education level: Not on file  Occupational History  . Not on file  Tobacco Use  . Smoking status: Former Smoker    Quit date: 04/29/1972    Years since quitting: 47.8  . Smokeless tobacco: Never Used  . Tobacco comment: only tried in high school   Vaping Use  . Vaping Use: Never used  Substance and Sexual Activity  . Alcohol use: No  . Drug use: No  . Sexual activity: Not on file  Other Topics Concern  . Not on file  Social History Narrative  . Not on file   Social Determinants of Health   Financial Resource Strain:   . Difficulty of Paying Living Expenses:  Food Insecurity:   . Worried About Charity fundraiser in the Last Year:   . Arboriculturist in the Last Year:   Transportation Needs:   . Film/video editor (Medical):   Marland Kitchen Lack of Transportation (Non-Medical):   Physical Activity:   . Days of Exercise per Week:   . Minutes of Exercise per Session:   Stress:   . Feeling of Stress :   Social Connections:   . Frequency of Communication with Friends and Family:   . Frequency of Social Gatherings with Friends and Family:   . Attends Religious Services:   . Active Member of Clubs or Organizations:   . Attends Archivist Meetings:   Marland Kitchen Marital Status:    Intimate Partner Violence:   . Fear of Current or Ex-Partner:   . Emotionally Abused:   Marland Kitchen Physically Abused:   . Sexually Abused:       Review of Systems  Constitutional: Negative for chills, fatigue and unexpected weight change.  HENT: Negative for congestion, rhinorrhea, sneezing and sore throat.   Eyes: Negative for photophobia, pain and redness.  Respiratory: Negative for cough, chest tightness and shortness of breath.   Cardiovascular: Negative for chest pain and palpitations.  Gastrointestinal: Negative for abdominal pain, constipation, diarrhea, nausea and vomiting.  Endocrine: Negative.   Genitourinary: Negative for dysuria and frequency.  Musculoskeletal: Negative for arthralgias, back pain, joint swelling and neck pain.  Skin: Negative for rash.  Allergic/Immunologic: Negative.   Neurological: Negative for tremors and numbness.  Hematological: Negative for adenopathy. Does not bruise/bleed easily.  Psychiatric/Behavioral: Negative for behavioral problems and sleep disturbance. The patient is not nervous/anxious.     Vital Signs: Temp 97.8 F (36.6 C)   Resp 16   Ht 5\' 10"  (1.778 m)   Wt 142 lb (64.4 kg)   BMI 20.37 kg/m    Observation/Objective:  NAD noted.    Assessment/Plan: 1. Respiratory infection Advised patient to take entire course of antibiotics as prescribed with food. Pt should return to clinic in 7-10 days if symptoms fail to improve or new symptoms develop.  - azithromycin (ZITHROMAX) 250 MG tablet; Use as directed  Dispense: 6 tablet; Refill: 0  2. Cough Reviewed risks and possible side effects associated with taking opiates, benzodiazepines and other CNS depressants. Combination of these could cause dizziness and drowsiness. Advised patient not to drive or operate machinery when taking these medications, as patient's and other's life can be at risk and will have consequences. Patient verbalized understanding in this matter. Dependence and abuse  for these drugs will be monitored closely. A Controlled substance policy and procedure is on file which allows [facility] to order a urine drug screen test at any visit. Patient understands and agrees with the plan - guaiFENesin-codeine (ROBITUSSIN AC) 100-10 MG/5ML syrup; Take 5 mLs by mouth 3 (three) times daily as needed for cough.  Dispense: 118 mL; Refill: 0  General Counseling: [place] verbalizes understanding of the findings of today's phone visit and agrees with plan of treatment. I have discussed any further diagnostic evaluation that may be needed or ordered today. We also reviewed her medications today. she has been encouraged to call the office with any questions or concerns that should arise related to todays visit.    No orders of the defined types were placed in this encounter.   Meds ordered this encounter  Medications  . azithromycin (ZITHROMAX) 250 MG tablet    Sig: Use as directed  Dispense:  6 tablet    Refill:  0  . guaiFENesin-codeine (ROBITUSSIN AC) 100-10 MG/5ML syrup    Sig: Take 5 mLs by mouth 3 (three) times daily as needed for cough.    Dispense:  118 mL    Refill:  0    Time spent: [place] AGNP-C Internal medicine

## 2020-05-16 DIAGNOSIS — E063 Autoimmune thyroiditis: Secondary | ICD-10-CM | POA: Diagnosis not present

## 2020-06-07 ENCOUNTER — Other Ambulatory Visit: Payer: Self-pay

## 2020-06-07 ENCOUNTER — Ambulatory Visit (INDEPENDENT_AMBULATORY_CARE_PROVIDER_SITE_OTHER): Payer: Medicare Other | Admitting: Nurse Practitioner

## 2020-06-07 ENCOUNTER — Encounter: Payer: Self-pay | Admitting: Nurse Practitioner

## 2020-06-07 VITALS — BP 128/72 | HR 60 | Temp 97.4°F | Resp 16 | Ht 70.0 in | Wt 143.6 lb

## 2020-06-07 DIAGNOSIS — Z0001 Encounter for general adult medical examination with abnormal findings: Secondary | ICD-10-CM

## 2020-06-07 DIAGNOSIS — Z1231 Encounter for screening mammogram for malignant neoplasm of breast: Secondary | ICD-10-CM | POA: Diagnosis not present

## 2020-06-07 DIAGNOSIS — R300 Dysuria: Secondary | ICD-10-CM

## 2020-06-07 DIAGNOSIS — E039 Hypothyroidism, unspecified: Secondary | ICD-10-CM | POA: Diagnosis not present

## 2020-06-07 DIAGNOSIS — Z13820 Encounter for screening for osteoporosis: Secondary | ICD-10-CM

## 2020-06-07 DIAGNOSIS — E2839 Other primary ovarian failure: Secondary | ICD-10-CM | POA: Diagnosis not present

## 2020-06-07 NOTE — Progress Notes (Signed)
[facility] [place], [place] [address]  Internal MEDICINE  Office Visit Note  Patient Name: Stephanie Dillon  [id number]  [id number]  Date of Service: 06/29/2020   Pt is here for routine health maintenance examination  Chief Complaint  Patient presents with  . Medicare Wellness    antibodies  . policy update form    received  . Quality Metric Gaps    PNA     The patient is here for health maintenance exam. She had her thyroid checked in October, 2021. Thyroid was slightly underactive and levothyroxine was increased one day of the week.  She would like would like to go back to see urologist she has seen in the past due to prolapsed bladder. Has been doing recommended exercises, however, problem prolapse seems to be worsening.  She is due to have routine, fasting labs. Would like to check COVID antibodies. Believes she may have had this in 2020. She should also be scheduled for mammogram and bone density tests.     Current Medication: Outpatient Encounter Medications as of 06/07/2020  Medication Sig  . Biotin 1 MG CAPS Take 1 tablet by mouth daily.  . calcium citrate-vitamin D (CITRACAL+D) 315-200 MG-UNIT per tablet Take 1 tablet by mouth daily.  . Cyanocobalamin (B-12) 250 MCG TABS Take 250 mcg by mouth daily.  . folic acid (FOLVITE) 782 MCG tablet Take 1 tablet by mouth daily.  Marland Kitchen levothyroxine (SYNTHROID, LEVOTHROID) 50 MCG tablet Take 50 mcg by mouth daily before breakfast.  . Multiple Vitamin (MULTIVITAMIN) tablet Take 1 tablet by mouth daily.  . vitamin C (ASCORBIC ACID) 500 MG tablet Take 500 mg by mouth daily.  Marland Kitchen azithromycin (ZITHROMAX) 250 MG tablet Use as directed (Patient not taking: Reported on 06/07/2020)  . guaiFENesin-codeine (ROBITUSSIN AC) 100-10 MG/5ML syrup Take 5 mLs by mouth 3 (three) times daily as needed for cough. (Patient not taking: Reported on 06/07/2020)  . [DISCONTINUED] nitrofurantoin, macrocrystal-monohydrate, (MACROBID) 100  MG capsule Take 1 capsule (100 mg total) by mouth 2 (two) times daily. (Patient not taking: Reported on 06/07/2020)   No facility-administered encounter medications on file as of 06/07/2020.    Surgical History: Past Surgical History:  Procedure Laterality Date  . TUBAL LIGATION      Medical History: Past Medical History:  Diagnosis Date  . Hypothyroid   . Thyroid disease     Family History: Family History  Problem Relation Age of Onset  . Colon cancer Mother   . AAA (abdominal aortic aneurysm) Mother   . Breast cancer Neg Hx       Review of Systems  Constitutional: Negative for activity change, chills, fatigue and unexpected weight change.  HENT: Negative for congestion, postnasal drip, rhinorrhea, sneezing and sore throat.   Respiratory: Negative for cough, chest tightness and shortness of breath.   Cardiovascular: Negative for chest pain and palpitations.  Gastrointestinal: Negative for abdominal pain, constipation, diarrhea, nausea and vomiting.  Endocrine: Negative for cold intolerance, heat intolerance, polydipsia and polyuria.       Patient has chronic hypothyroid which is managed per endocrinology.   Genitourinary: Positive for frequency and urgency. Negative for dysuria.       Chronic bladder prolapse. Did see urologist for this In the past. Feels as though this has gotten worse and would to cosult with uro/gyn again.   Musculoskeletal: Negative for arthralgias, back pain, joint swelling and neck pain.  Skin: Negative for rash.  Allergic/Immunologic: Negative for environmental allergies.  Neurological:  Negative for dizziness, tremors, numbness and headaches.  Hematological: Negative for adenopathy. Does not bruise/bleed easily.  Psychiatric/Behavioral: Negative for behavioral problems (Depression), sleep disturbance and suicidal ideas. The patient is not nervous/anxious.      Today's Vitals   06/07/20 0914  BP: 128/72  Pulse: 60  Resp: 16  Temp: (!) 97.4 F  (36.3 C)  SpO2: 99%  Weight: 143 lb 9.6 oz (65.1 kg)  Height: 5\' 10"  (1.778 m)   Body mass index is 20.6 kg/m.  Physical Exam Vitals and nursing note reviewed.  Constitutional:      General: She is not in acute distress.    Appearance: Normal appearance. She is well-developed. She is not diaphoretic.  HENT:     Head: Normocephalic and atraumatic.     Nose: Nose normal.     Mouth/Throat:     Pharynx: No oropharyngeal exudate.  Eyes:     Pupils: Pupils are equal, round, and reactive to light.  Neck:     Thyroid: No thyromegaly.     Vascular: No carotid bruit or JVD.     Trachea: No tracheal deviation.  Cardiovascular:     Rate and Rhythm: Normal rate and regular rhythm.     Pulses: Normal pulses.     Heart sounds: Normal heart sounds. No murmur heard.  No friction rub. No gallop.   Pulmonary:     Effort: Pulmonary effort is normal. No respiratory distress.     Breath sounds: Normal breath sounds. No wheezing or rales.  Chest:     Chest wall: No tenderness.     Breasts:        Right: Normal. No swelling, bleeding, inverted nipple, mass, nipple discharge, skin change or tenderness.        Left: Normal. No swelling, bleeding, inverted nipple, mass, nipple discharge, skin change or tenderness.  Abdominal:     General: Bowel sounds are normal.     Palpations: Abdomen is soft.     Tenderness: There is no abdominal tenderness.  Musculoskeletal:        General: Normal range of motion.     Cervical back: Normal range of motion and neck supple.  Lymphadenopathy:     Cervical: No cervical adenopathy.     Upper Body:     Right upper body: No axillary adenopathy.     Left upper body: No axillary adenopathy.  Skin:    General: Skin is warm and dry.     Capillary Refill: Capillary refill takes less than 2 seconds.  Neurological:     General: No focal deficit present.     Mental Status: She is alert and oriented to person, place, and time.     Cranial Nerves: No cranial nerve  deficit.  Psychiatric:        Mood and Affect: Mood normal.        Behavior: Behavior normal.        Thought Content: Thought content normal.        Judgment: Judgment normal.    Depression screen [facility] 2/9 06/07/2020 06/07/2019 05/02/2018  Decreased Interest 0 0 0  Down, Depressed, Hopeless 0 0 0  PHQ - 2 Score 0 0 0    Functional Status Survey: Is the patient deaf or have difficulty hearing?: No Does the patient have difficulty seeing, even when wearing glasses/contacts?: No Does the patient have difficulty concentrating, remembering, or making decisions?: No Does the patient have difficulty walking or climbing stairs?: No Does the patient have difficulty  dressing or bathing?: No Does the patient have difficulty doing errands alone such as visiting a doctor's office or shopping?: No  MMSE - [place] Exam 06/07/2020 05/02/2018  Orientation to time 5 5  Orientation to Place 5 5  Registration 3 3  Attention/ Calculation 5 5  Recall 3 3  Language- name 2 objects 2 2  Language- repeat 1 1  Language- follow 3 step command 3 3  Language- read & follow direction 1 1  Write a sentence 1 1  Copy design 1 1  Total score 30 30    Fall Risk  06/07/2020 06/07/2019 05/02/2018  Falls in the past year? 0 0 No  Risk for fall due to : No Fall Risks - -  Follow up Falls evaluation completed - -      LABS: Recent Results (from the past 2160 hour(s))  UA/M w/rflx Culture, Routine     Status: Abnormal   Collection Time: 06/07/20 11:42 AM   Specimen: Urine   Urine  Result Value Ref Range   Specific Gravity, UA 1.009 1.005 - 1.030   pH, UA 6.5 5.0 - 7.5   Color, UA Yellow Yellow   Appearance Ur Clear Clear   Leukocytes,UA Trace (A) Negative   Protein,UA Negative Negative/Trace   Glucose, UA Negative Negative   Ketones, UA Negative Negative   RBC, UA Negative Negative   Bilirubin, UA Negative Negative   Urobilinogen, Ur 0.2 0.2 - 1.0 mg/dL   Nitrite, UA Positive (A) Negative    Microscopic Examination See below:     Comment: Microscopic was indicated and was performed.   Urinalysis Reflex Comment     Comment: This specimen has reflexed to a Urine Culture.  Microscopic Examination     Status: Abnormal   Collection Time: 06/07/20 11:42 AM   Urine  Result Value Ref Range   WBC, UA 0-5 0 - 5 /hpf   RBC None seen 0 - 2 /hpf   Epithelial Cells (non renal) 0-10 0 - 10 /hpf   Casts None seen None seen /lpf   Bacteria, UA Many (A) None seen/Few  Urine Culture, Reflex     Status: Abnormal   Collection Time: 06/07/20 11:42 AM   Urine  Result Value Ref Range   Urine Culture, Routine Final report (A)    Organism ID, Bacteria Escherichia coli (A)     Comment: Cefazolin <=4 ug/mL Cefazolin with an MIC <=16 predicts susceptibility to the oral agents cefaclor, cefdinir, cefpodoxime, cefprozil, cefuroxime, cephalexin, and loracarbef when used for therapy of uncomplicated urinary tract infections due to E. coli, Klebsiella pneumoniae, and Proteus mirabilis. Greater than 100,000 colony forming units per mL    Antimicrobial Susceptibility Comment     Comment:       ** S = Susceptible; I = Intermediate; R = Resistant **                    P = Positive; N = Negative             MICS are expressed in micrograms per mL    Antibiotic                 RSLT#1    RSLT#2    RSLT#3    RSLT#4 Amoxicillin/Clavulanic Acid    S Ampicillin                     S Cefepime  S Ceftriaxone                    S Cefuroxime                     S Ciprofloxacin                  S Ertapenem                      S Gentamicin                     S Imipenem                       S Levofloxacin                   S Meropenem                      S Nitrofurantoin                 S Piperacillin/Tazobactam        S Tetracycline                   S Tobramycin                     S Trimethoprim/Sulfa             S     Assessment/Plan: 1. Encounter for general adult medical  examination with abnormal findings Annual health maintenance exam today. Order slip given to have routine, fasting labs drawn.   2. Acquired hypothyroidism Managed per endocrinology  3. Ovarian failure Bone density test ordered today.  - DG Bone Density; Future  4. Encounter for screening mammogram for malignant neoplasm of breast - MM 3D SCREEN BREAST BILATERAL; Future  5. Dysuria - UA/M w/rflx Culture, Routine  General Counseling: Bao verbalizes understanding of the findings of todays visit and agrees with plan of treatment. I have discussed any further diagnostic evaluation that may be needed or ordered today. We also reviewed her medications today. she has been encouraged to call the office with any questions or concerns that should arise related to todays visit.    Counseling:  This patient was seen by Leretha Pol FNP Collaboration with Dr Lavera Guise as a part of collaborative care agreement  Orders Placed This Encounter  Procedures  . Microscopic Examination  . Urine Culture, Reflex  . DG Bone Density  . MM 3D SCREEN BREAST BILATERAL  . UA/M w/rflx Culture, Routine     Total time spent: 31 Minutes  Time spent includes review of chart, medications, test results, and follow up plan with the patient.     Lavera Guise, MD  Internal Medicine

## 2020-06-12 ENCOUNTER — Other Ambulatory Visit: Payer: Self-pay | Admitting: Nurse Practitioner

## 2020-06-12 ENCOUNTER — Telehealth: Payer: Self-pay

## 2020-06-12 DIAGNOSIS — N390 Urinary tract infection, site not specified: Secondary | ICD-10-CM

## 2020-06-12 LAB — UA/M W/RFLX CULTURE, ROUTINE
Bilirubin, UA: NEGATIVE
Glucose, UA: NEGATIVE
Ketones, UA: NEGATIVE
Nitrite, UA: POSITIVE — AB
Protein,UA: NEGATIVE
RBC, UA: NEGATIVE
Specific Gravity, UA: 1.009 (ref 1.005–1.030)
Urobilinogen, Ur: 0.2 mg/dL (ref 0.2–1.0)
pH, UA: 6.5 (ref 5.0–7.5)

## 2020-06-12 LAB — URINE CULTURE, REFLEX

## 2020-06-12 LAB — MICROSCOPIC EXAMINATION
Casts: NONE SEEN /lpf
RBC, Urine: NONE SEEN /hpf (ref 0–2)

## 2020-06-12 MED ORDER — NITROFURANTOIN MONOHYD MACRO 100 MG PO CAPS: 100.0000 mg | ORAL_CAPSULE | Freq: Two times a day (BID) | ORAL | 0 refills | Status: DC

## 2020-06-12 NOTE — Telephone Encounter (Signed)
-----   Message from Ronnell Freshwater, NP sent at 06/12/2020  8:52 AM EST ----- Hey. Please let the patient know that urine sample from her physical indicated urinary tract infection. I have sent prescription for macrobid to her [facility]. This is twice daily for next 7 days. Thanks.

## 2020-06-12 NOTE — Progress Notes (Signed)
LMOM for pt to call back. Need to tell pt that her urine sample from her CPE indicates a UTI and macrobid (twice daily for the next 7 days) has been sent to her [place].

## 2020-06-12 NOTE — Telephone Encounter (Signed)
Spoke with pt, informed her of UTI and that macrobid was sent to her pharmacy.

## 2020-06-12 NOTE — Progress Notes (Signed)
Hey. Please let the patient know that urine sample from her physical indicated urinary tract infection. I have sent prescription for macrobid to her [facility]. This is twice daily for next 7 days. Thanks.

## 2020-06-17 ENCOUNTER — Other Ambulatory Visit: Payer: Self-pay | Admitting: Adult Health

## 2020-06-17 ENCOUNTER — Ambulatory Visit (HOSPITAL_COMMUNITY)
Admission: RE | Admit: 2020-06-17 | Discharge: 2020-06-17 | Disposition: A | Discharge status: Home / Self Care | Payer: Medicare Other | Source: Ambulatory Visit | Attending: Pulmonary Disease | Admitting: Pulmonary Disease

## 2020-06-17 ENCOUNTER — Other Ambulatory Visit: Payer: Self-pay | Admitting: Nurse Practitioner

## 2020-06-17 DIAGNOSIS — U071 COVID-19: Secondary | ICD-10-CM | POA: Diagnosis not present

## 2020-06-17 MED ORDER — METHYLPREDNISOLONE SODIUM SUCC 125 MG IJ SOLR: 125.0000 mg | Freq: Once | INTRAMUSCULAR | Status: DC | PRN

## 2020-06-17 MED ORDER — FAMOTIDINE IN NACL 20-0.9 MG/50ML-% IV SOLN: 20.0000 mg | Freq: Once | INTRAVENOUS | Status: DC | PRN

## 2020-06-17 MED ORDER — SODIUM CHLORIDE 0.9 % IV SOLN: INTRAVENOUS | Status: DC | PRN

## 2020-06-17 MED ORDER — ALBUTEROL SULFATE HFA 108 (90 BASE) MCG/ACT IN AERS: 2.0000 | INHALATION_SPRAY | Freq: Once | RESPIRATORY_TRACT | Status: DC | PRN

## 2020-06-17 MED ORDER — EPINEPHRINE 0.3 MG/0.3ML IJ SOAJ: 0.3000 mg | Freq: Once | INTRAMUSCULAR | Status: DC | PRN

## 2020-06-17 MED ORDER — SOTROVIMAB 500 MG/8ML IV SOLN
500.0000 mg | Freq: Once | INTRAVENOUS | Status: AC
  Administered 2020-06-17: 500 mg via INTRAVENOUS

## 2020-06-17 MED ORDER — DIPHENHYDRAMINE HCL 50 MG/ML IJ SOLN: 50.0000 mg | Freq: Once | INTRAMUSCULAR | Status: DC | PRN

## 2020-06-17 NOTE — Progress Notes (Signed)
I connected by phone with Stephanie Dillon on 06/17/2020 at 1:31 PM to discuss the potential use of a treatment for mild to moderate COVID-19 viral infection in non-hospitalized patients.  This patient is a 69 y.o. female that meets the FDA criteria for Emergency Use Authorization of bamlanivimab/etesevimab, casirivimab\imdevimab, or sotrovimab  Has a (+) direct SARS-CoV-2 viral test result  Has mild or moderate COVID-19   Is ? 68 years of age and weighs ? 40 kg  Is NOT hospitalized due to COVID-19  Is NOT requiring oxygen therapy or requiring an increase in baseline oxygen flow rate due to COVID-19  Is within 10 days of symptom onset  Has at least one of the high risk factor(s) for progression to severe COVID-19 and/or hospitalization as defined in EUA.  Specific high risk criteria : Older age (>/= 68 yo)   I have spoken and communicated the following to the patient or parent/caregiver:  1. FDA has authorized the emergency use of bamlanivimab/etesevimab, casirivimab\imdevimab, or sotrovimab for the treatment of mild to moderate COVID-19 in adults and pediatric patients with positive results of direct SARS-CoV-2 viral testing who are 66 years of age and older weighing at least 40 kg, and who are at high risk for progressing to severe COVID-19 and/or hospitalization.  2. The significant known and potential risks and benefits of bamlanivimab/etesevimab, casirivimab\imdevimab, or sotrovimab, and the extent to which such potential risks and benefits are unknown.  3. Information on available alternative treatments and the risks and benefits of those alternatives, including clinical trials.  4. Patients treated with bamlanivimab/etesevimab, casirivimab\imdevimab, or sotrovimab should continue to self-isolate and use infection control measures (e.g., wear mask, isolate, social distance, avoid sharing personal items, clean and disinfect "high touch" surfaces, and frequent handwashing) according to  CDC guidelines.   5. The patient or parent/caregiver has the option to accept or refuse bamlanivimab/etesevimab, casirivimab\imdevimab, or sotrovimab.  After reviewing this information with the patient, the patient has agreed to receive one of the available covid 19 monoclonal antibodies and will be provided an appropriate fact sheet prior to infusion.Stephanie Dillon, [place], AGNP-C [phone] ([place])

## 2020-06-17 NOTE — Discharge Instructions (Signed)

## 2020-06-17 NOTE — Progress Notes (Signed)
Diagnosis: COVID-19  Physician: Dr. Patrick Wright  Procedure: Covid Infusion Clinic Med: Sotrovimab infusion - Provided patient with sotrovimab fact sheet for patients, parents, and caregivers prior to infusion.   Complications: No immediate complications noted  Discharge: Discharged home    

## 2020-07-09 ENCOUNTER — Other Ambulatory Visit: Payer: Self-pay

## 2020-07-16 ENCOUNTER — Ambulatory Visit
Admission: RE | Admit: 2020-07-16 | Discharge: 2020-07-16 | Disposition: A | Discharge status: Home / Self Care | Payer: Medicare Other | Source: Ambulatory Visit | Attending: Nurse Practitioner | Admitting: Nurse Practitioner

## 2020-07-16 ENCOUNTER — Other Ambulatory Visit: Payer: Self-pay

## 2020-07-16 ENCOUNTER — Other Ambulatory Visit
Admission: RE | Admit: 2020-07-16 | Discharge: 2020-07-16 | Disposition: A | Discharge status: Home / Self Care | Payer: Medicare Other | Source: Home / Self Care | Attending: Nurse Practitioner | Admitting: Nurse Practitioner

## 2020-07-16 ENCOUNTER — Telehealth: Payer: Self-pay

## 2020-07-16 DIAGNOSIS — E2839 Other primary ovarian failure: Secondary | ICD-10-CM | POA: Insufficient documentation

## 2020-07-16 DIAGNOSIS — E785 Hyperlipidemia, unspecified: Secondary | ICD-10-CM | POA: Insufficient documentation

## 2020-07-16 DIAGNOSIS — Z1231 Encounter for screening mammogram for malignant neoplasm of breast: Secondary | ICD-10-CM | POA: Diagnosis present

## 2020-07-16 DIAGNOSIS — E559 Vitamin D deficiency, unspecified: Secondary | ICD-10-CM | POA: Insufficient documentation

## 2020-07-16 DIAGNOSIS — Z0000 Encounter for general adult medical examination without abnormal findings: Secondary | ICD-10-CM | POA: Diagnosis not present

## 2020-07-16 DIAGNOSIS — Z780 Asymptomatic menopausal state: Secondary | ICD-10-CM | POA: Diagnosis not present

## 2020-07-16 DIAGNOSIS — Z8739 Personal history of other diseases of the musculoskeletal system and connective tissue: Secondary | ICD-10-CM | POA: Diagnosis not present

## 2020-07-16 LAB — CBC
HCT: 37.8 % (ref 36.0–46.0)
Hemoglobin: 12.9 g/dL (ref 12.0–15.0)
MCH: 30.8 pg (ref 26.0–34.0)
MCHC: 34.1 g/dL (ref 30.0–36.0)
MCV: 90.2 fL (ref 80.0–100.0)
Platelets: 265 10*3/uL (ref 150–400)
RBC: 4.19 MIL/uL (ref 3.87–5.11)
RDW: 12.6 % (ref 11.5–15.5)
WBC: 4.5 10*3/uL (ref 4.0–10.5)
nRBC: 0 % (ref 0.0–0.2)

## 2020-07-16 LAB — COMPREHENSIVE METABOLIC PANEL
ALT: 14 U/L (ref 0–44)
AST: 20 U/L (ref 15–41)
Albumin: 4 g/dL (ref 3.5–5.0)
Alkaline Phosphatase: 67 U/L (ref 38–126)
Anion gap: 9 (ref 5–15)
BUN: 10 mg/dL (ref 8–23)
CO2: 27 mmol/L (ref 22–32)
Calcium: 9.3 mg/dL (ref 8.9–10.3)
Chloride: 100 mmol/L (ref 98–111)
Creatinine, Ser: 0.57 mg/dL (ref 0.44–1.00)
GFR, Estimated: >60 mL/min (ref >60)
Glucose, Bld: 90 mg/dL (ref 70–99)
Potassium: 3.8 mmol/L (ref 3.5–5.1)
Sodium: 136 mmol/L (ref 135–145)
Total Bilirubin: 1.3 mg/dL — ABNORMAL HIGH (ref 0.3–1.2)
Total Protein: 7.7 g/dL (ref 6.5–8.1)

## 2020-07-16 LAB — VITAMIN D 25 HYDROXY (VIT D DEFICIENCY, FRACTURES): Vit D, 25-Hydroxy: 38.84 ng/mL (ref 30–100)

## 2020-07-16 LAB — LIPID PANEL
Cholesterol: 225 mg/dL — ABNORMAL HIGH (ref 0–200)
HDL: 92 mg/dL (ref >40)
LDL Cholesterol: 121 mg/dL — ABNORMAL HIGH (ref 0–99)
Total CHOL/HDL Ratio: 2.4 RATIO
Triglycerides: 58 mg/dL (ref <150)
VLDL: 12 mg/dL (ref 0–40)

## 2020-07-16 NOTE — Telephone Encounter (Signed)
Ok. That's fine.

## 2020-07-29 DIAGNOSIS — H31001 Unspecified chorioretinal scars, right eye: Secondary | ICD-10-CM | POA: Diagnosis not present

## 2020-08-01 ENCOUNTER — Telehealth: Payer: Self-pay

## 2020-08-13 NOTE — Telephone Encounter (Signed)
Please let the patient know that her labs indicate mild elevation of bad and total cholesterol. It is really unchanged since last check. Other labs were normal. Her bone density is normal in spine, pelvis, and femur. This time, they included the forearm, which they had not done in prior studies. 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 I would recommend start on low dose fosamax which is taken weekly. If she is aggreeable to that, I can send it in. Thanks.

## 2020-08-14 NOTE — Telephone Encounter (Signed)
LMOM with the bone density results and informed pt that we can start her on fosamax if she is aggreeable.  Informed patient on message to call us back to let us know if she would like to start the medication

## 2020-09-05 DIAGNOSIS — M65341 Trigger finger, right ring finger: Secondary | ICD-10-CM | POA: Diagnosis not present

## 2020-09-17 ENCOUNTER — Other Ambulatory Visit: Payer: Self-pay

## 2020-09-17 ENCOUNTER — Ambulatory Visit (INDEPENDENT_AMBULATORY_CARE_PROVIDER_SITE_OTHER): Payer: Medicare Other | Admitting: Dermatology

## 2020-09-17 DIAGNOSIS — L814 Other melanin hyperpigmentation: Secondary | ICD-10-CM | POA: Diagnosis not present

## 2020-09-17 DIAGNOSIS — L821 Other seborrheic keratosis: Secondary | ICD-10-CM | POA: Diagnosis not present

## 2020-09-17 DIAGNOSIS — B360 Pityriasis versicolor: Secondary | ICD-10-CM

## 2020-09-17 DIAGNOSIS — Z1283 Encounter for screening for malignant neoplasm of skin: Secondary | ICD-10-CM

## 2020-09-17 DIAGNOSIS — D692 Other nonthrombocytopenic purpura: Secondary | ICD-10-CM | POA: Diagnosis not present

## 2020-09-17 DIAGNOSIS — D229 Melanocytic nevi, unspecified: Secondary | ICD-10-CM

## 2020-09-17 DIAGNOSIS — D1800 Hemangioma unspecified site: Secondary | ICD-10-CM

## 2020-09-17 DIAGNOSIS — L219 Seborrheic dermatitis, unspecified: Secondary | ICD-10-CM | POA: Diagnosis not present

## 2020-09-17 DIAGNOSIS — D2261 Melanocytic nevi of right upper limb, including shoulder: Secondary | ICD-10-CM | POA: Diagnosis not present

## 2020-09-17 DIAGNOSIS — D2262 Melanocytic nevi of left upper limb, including shoulder: Secondary | ICD-10-CM

## 2020-09-17 DIAGNOSIS — L578 Other skin changes due to chronic exposure to nonionizing radiation: Secondary | ICD-10-CM

## 2020-09-17 DIAGNOSIS — L820 Inflamed seborrheic keratosis: Secondary | ICD-10-CM | POA: Diagnosis not present

## 2020-09-17 DIAGNOSIS — B351 Tinea unguium: Secondary | ICD-10-CM | POA: Diagnosis not present

## 2020-09-17 MED ORDER — CICLOPIROX 8 % EX SOLN: Freq: Every day | CUTANEOUS | 11 refills | Status: DC

## 2020-09-17 NOTE — Patient Instructions (Addendum)

## 2020-09-17 NOTE — Progress Notes (Signed)
Follow-Up Visit   Subjective  Stephanie Dillon is a 70 y.o. female who presents for the following: tbse (Patient states she has a spot on right forearm that is itchy and rubs on clothing. ).  Pt used Eucrisa on temples which helped clear up rash.  She has noticed a discolored area on her toenail.  Patient here for full body skin exam and skin cancer screening.   The following portions of the chart were reviewed this encounter and updated as appropriate:      Objective  Well appearing patient in no apparent distress; mood and affect are within normal limits.  A full examination was performed including scalp, head, eyes, ears, nose, lips, neck, chest, axillae, abdomen, back, buttocks, bilateral upper extremities, bilateral lower extremities, hands, feet, fingers, toes, fingernails, and toenails. All findings within normal limits unless otherwise noted below.  Objective  bilateral temples: Clear   Objective  right wrist x 2 (2): Erythematous keratotic or waxy stuck-on papule x 2.   Objective  right upper arm: 3 mm speckled pink brown papule- stable  Left Upper Arm: 1 mm brown papule adjacent 56mm light brown macule on left upper arm- stable  Images      Objective  right great toe: Yellow brown discoloration lateral R great toenail  Images    Assessment & Plan  Seborrheic dermatitis bilateral temples  Improved with Eucrisa, continue prn flares   Inflamed seborrheic keratosis (2) right wrist x 2  Prior to procedure, discussed risks of blister formation, small wound, skin dyspigmentation, or rare scar following cryotherapy.  Destruction of lesion - right wrist x 2  Destruction method: cryotherapy   Informed consent: discussed and consent obtained   Lesion destroyed using liquid nitrogen: Yes   Region frozen until ice ball extended beyond lesion: Yes   Outcome: patient tolerated procedure well with no complications   Post-procedure details: wound care  instructions given    Nevus (2) Left Upper Arm; right upper arm  Benign-appearing.  Stable. Observation.  Call clinic for new or changing lesions.  Recommend daily use of broad spectrum spf 30+ sunscreen to sun-exposed areas.    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  will take months of regular use for nail to improve/clear  Also discussed [place] and [place], but not covered by insurance  ciclopirox (PENLAC) 8 % solution - right great toe   Lentigines Back, arms  - Scattered tan macules - Discussed due to sun exposure - Benign, observe - Call for any changes  Seborrheic Keratoses - Stuck-on, waxy, tan-brown papules and plaques  - Discussed benign etiology and prognosis. - Observe - Call for any changes  Melanocytic Nevi - Tan-brown and/or pink-flesh-colored symmetric macules and papules - Benign appearing on exam today - Observation - Call clinic for new or changing moles - Recommend daily use of broad spectrum spf 30+ sunscreen to sun-exposed areas.   Hemangiomas Back, arms  - Red papules - Discussed benign nature - Observe - Call for any changes  Purpura - Benign  Chronic; persistent and recurrent.  Treatable, but not curable. H/o of  - Violaceous macules and patches - Benign - Related to trauma, age, sun damage and/or use of blood thinners, chronic use of topical and/or oral steroids - Observe - Can use OTC arnica containing moisturizer such as Dermend Bruise Formula if desired - Call for worsening or  other concerns   Actinic Damage - Chronic, secondary to cumulative UV/sun exposure - diffuse scaly erythematous macules with underlying dyspigmentation - Recommend daily broad spectrum sunscreen SPF 30+ to sun-exposed areas, reapply every 2 hours as needed.  - Call for new or changing lesions.  Skin cancer screening  performed today.  Return in about 1 year (around 09/17/2021).  I, Ruthell Rummage, CMA, am acting as scribe for Brendolyn Patty, MD.  Documentation: I have reviewed the above documentation for accuracy and completeness, and I agree with the above.  Brendolyn Patty MD

## 2020-12-16 ENCOUNTER — Ambulatory Visit (INDEPENDENT_AMBULATORY_CARE_PROVIDER_SITE_OTHER): Payer: Medicare Other | Admitting: Dermatology

## 2020-12-16 ENCOUNTER — Other Ambulatory Visit: Payer: Self-pay

## 2020-12-16 DIAGNOSIS — L821 Other seborrheic keratosis: Secondary | ICD-10-CM

## 2020-12-16 DIAGNOSIS — L820 Inflamed seborrheic keratosis: Secondary | ICD-10-CM | POA: Diagnosis not present

## 2020-12-16 NOTE — Progress Notes (Signed)
   Follow-Up Visit   Subjective  Stephanie Dillon is a 70 y.o. female who presents for the following: recheck ISK (R wrist x 2, one cleared up the other still there, LN2 09/17/20). Also has another spot to check on arm that itches occasionally.   The following portions of the chart were reviewed this encounter and updated as appropriate:       Review of Systems:  No other skin or systemic complaints except as noted in HPI or Assessment and Plan.  Objective  Well appearing patient in no apparent distress; mood and affect are within normal limits.  A focused examination was performed including R wrist. Relevant physical exam findings are noted in the Assessment and Plan.  Objective  R wrist x 1: Residual stuck on waxy pap with erythema R wrist 4.51mm  Objective  Right Upper Arm: Stuck-on, waxy, tan-brown papule -- Discussed benign etiology and prognosis.    Assessment & Plan  Inflamed seborrheic keratosis R wrist x 1  Vs Wart (residual)  Prior to procedure, discussed risks of blister formation, small wound, skin dyspigmentation, or rare scar following cryotherapy.    Destruction of lesion - R wrist x 1  Destruction method: cryotherapy   Informed consent: discussed and consent obtained   Lesion destroyed using liquid nitrogen: Yes   Region frozen until ice ball extended beyond lesion: Yes   Outcome: patient tolerated procedure well with no complications   Post-procedure details: wound care instructions given    Seborrheic keratosis Right Upper Arm  Benign, observe Discussed otc HC cream prn itching/flares  Return if symptoms worsen or fail to improve.  I, Othelia Pulling, RMA, am acting as scribe for Brendolyn Patty, MD . Documentation: I have reviewed the above documentation for accuracy and completeness, and I agree with the above.  Brendolyn Patty MD

## 2020-12-16 NOTE — Patient Instructions (Signed)

## 2021-03-06 IMAGING — MG DIGITAL SCREENING BILAT W/ TOMO W/ CAD
6 of 10 series · 6 of 30 positions shown · non-contrast
Comparison: Previous exam(s).

CLINICAL DATA: Screening.

EXAM:
DIGITAL SCREENING BILATERAL MAMMOGRAM WITH TOMO AND CAD

[L MLO synth-2D (1 of 2)]
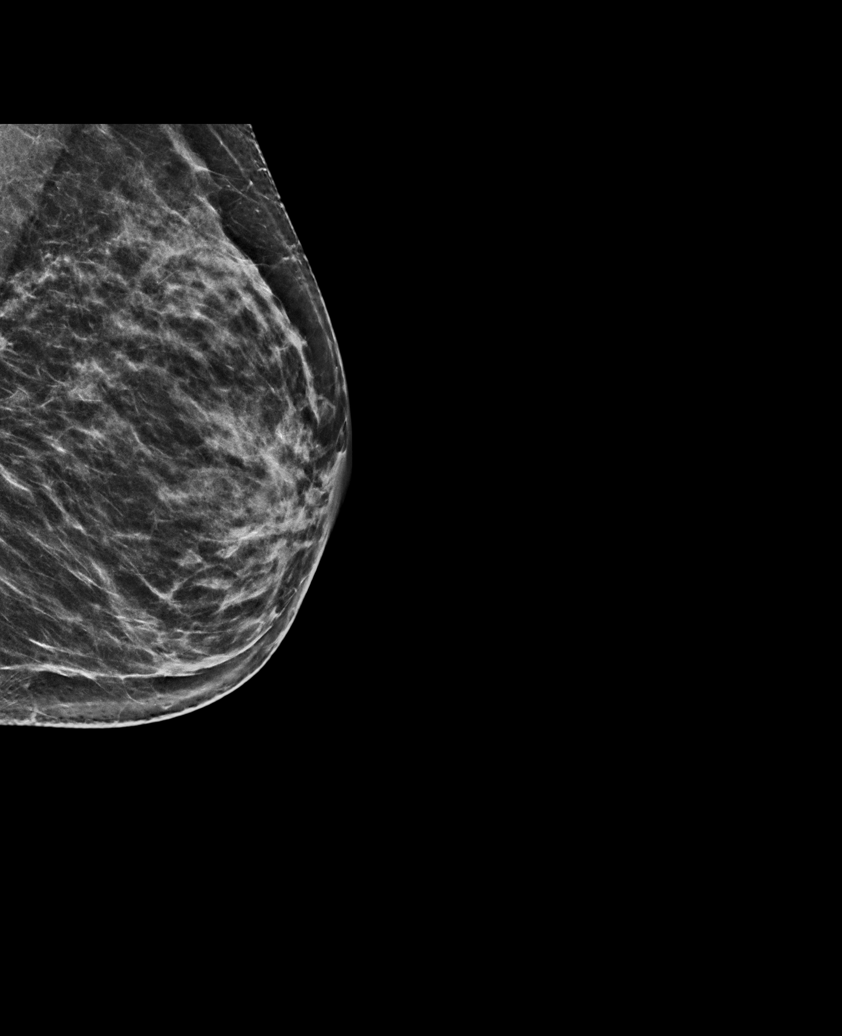

[R CC synth-2D]
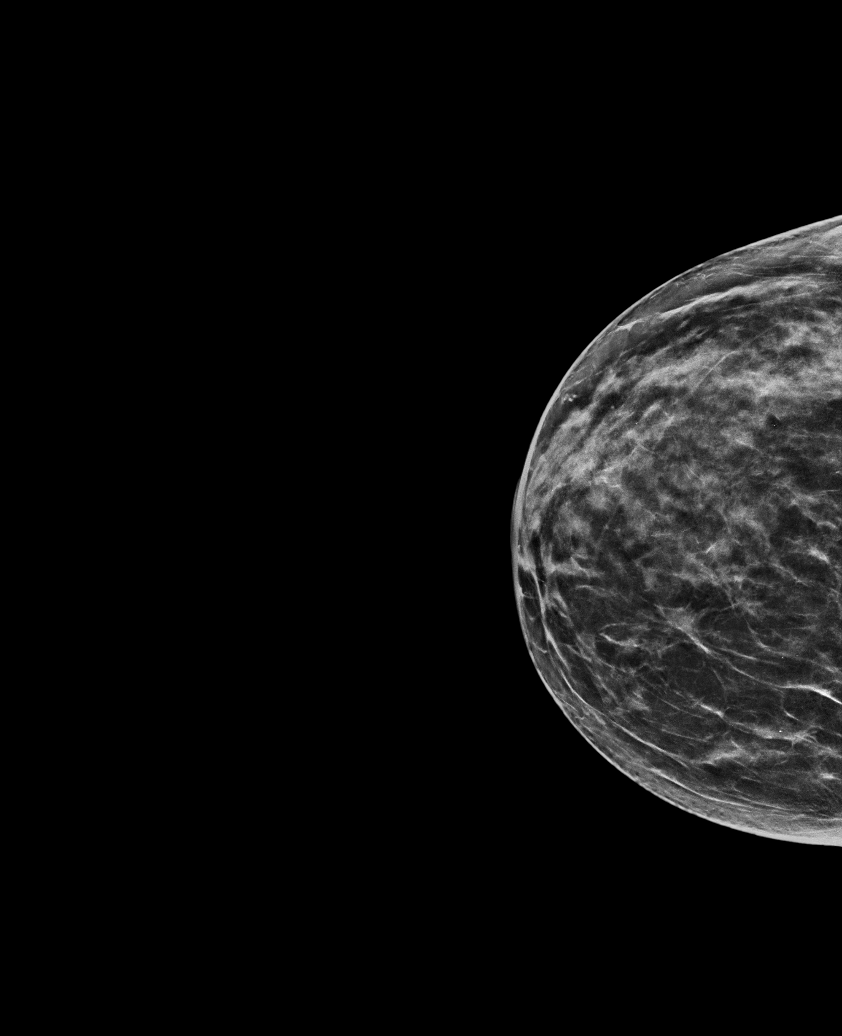

[R MLO synth-2D]
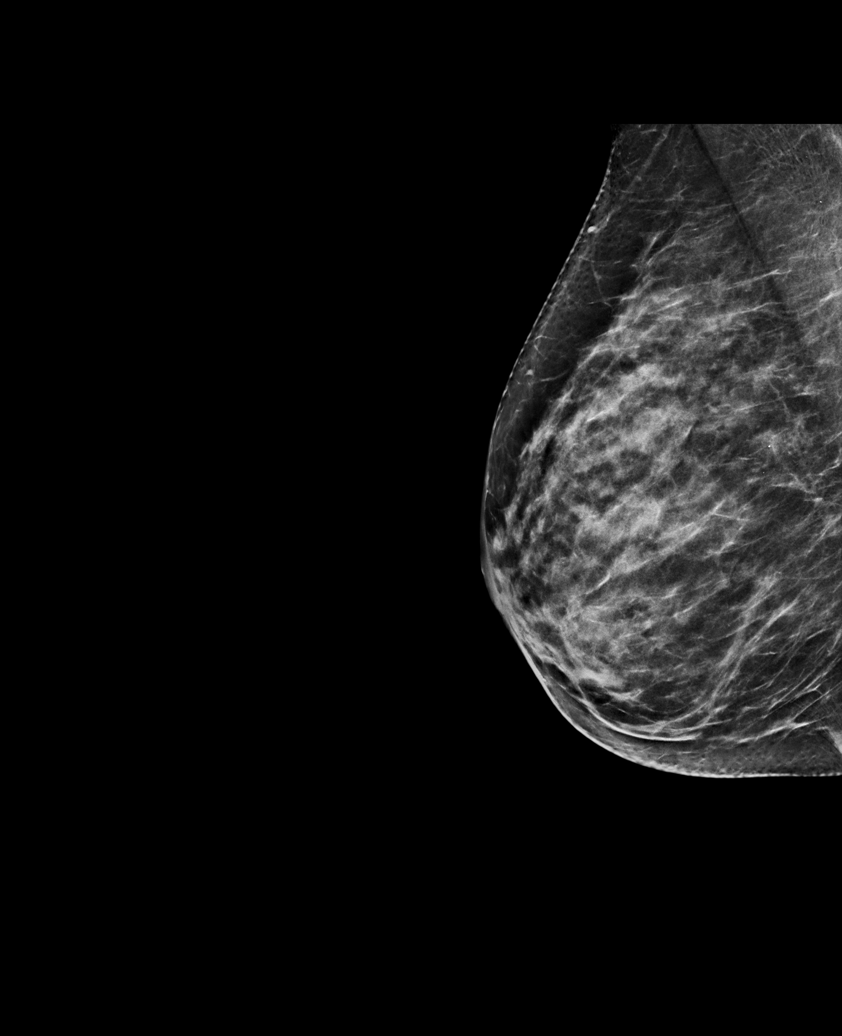

[L CC synth-2D]
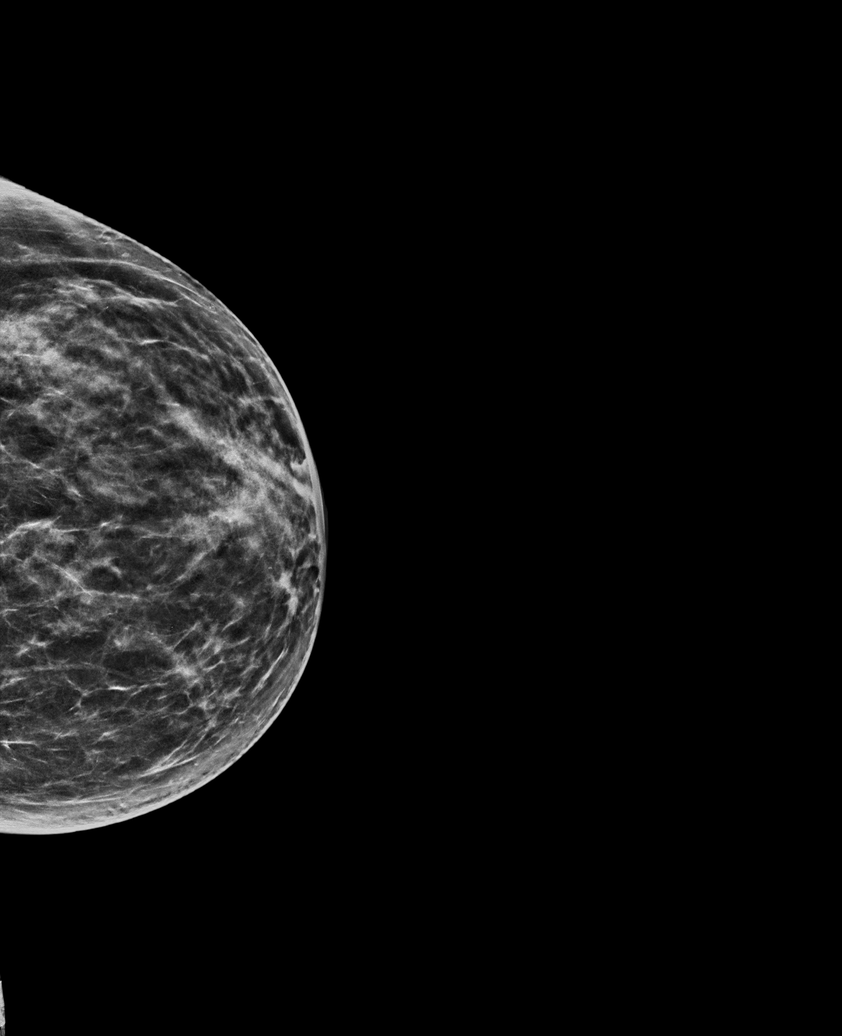

[L MLO synth-2D (2 of 2)]
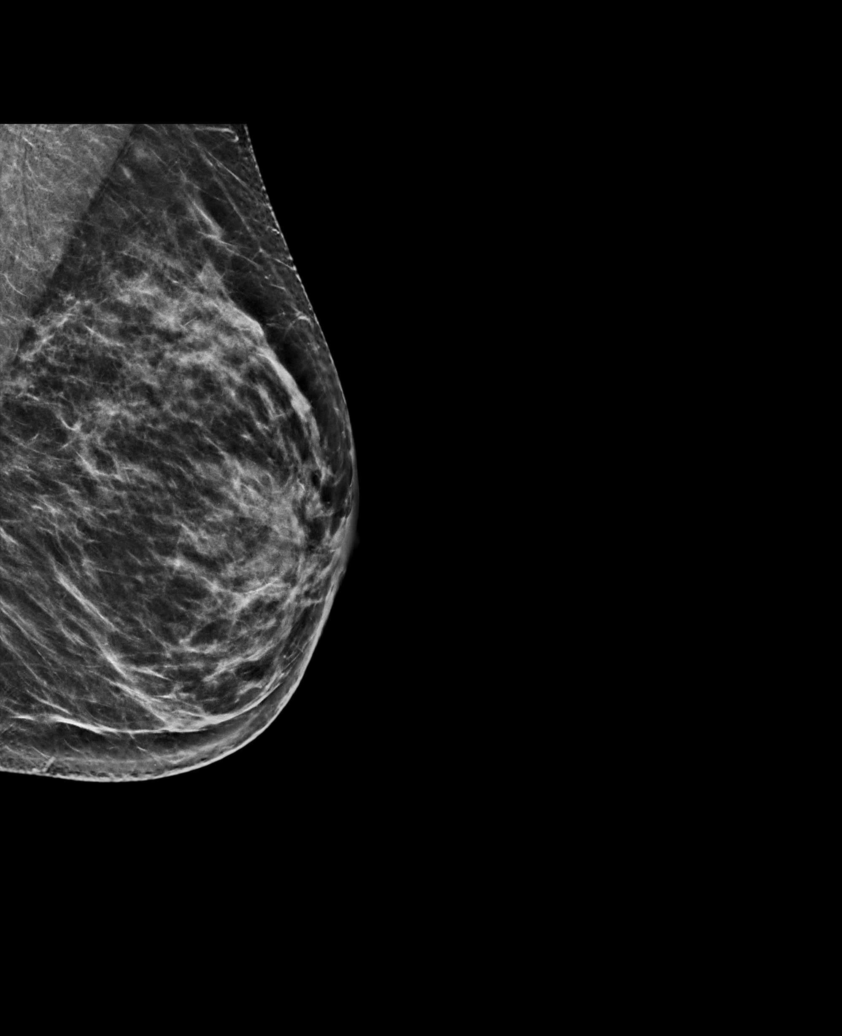

[R MLO tomo · tomo slice 35/69.0]
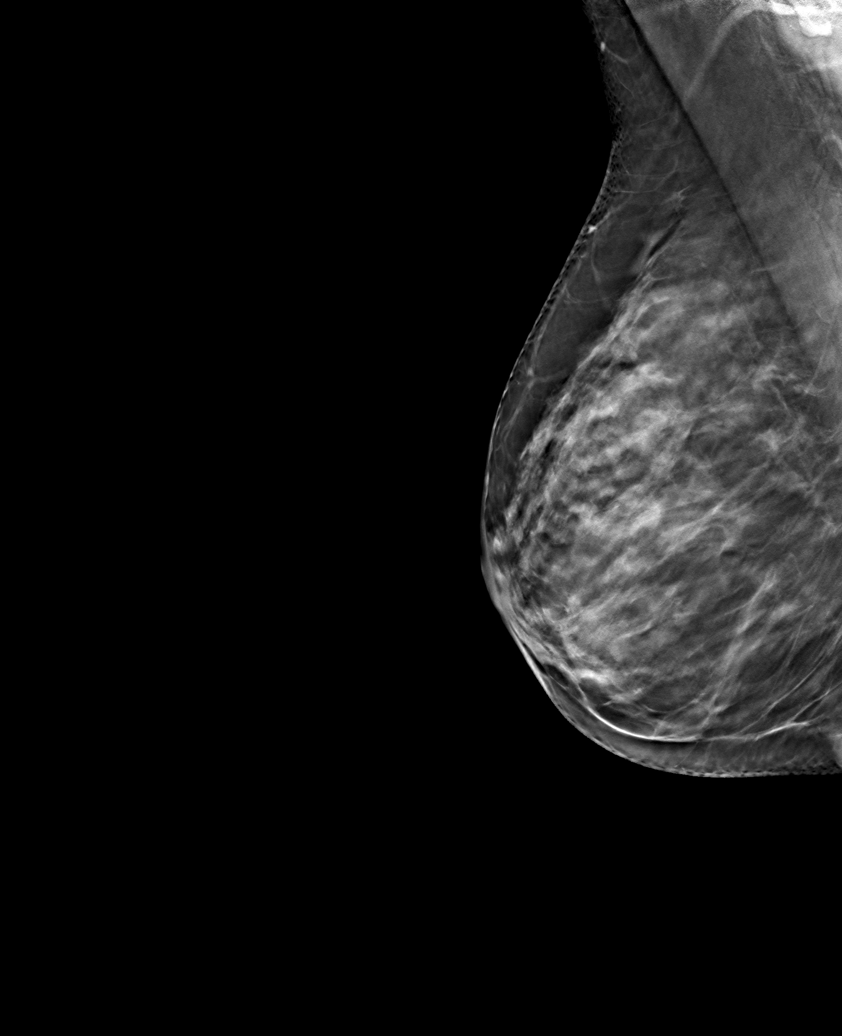

[6 of 30 positions shown; findings below may reference images not displayed]

ACR Breast Density Category c: The breast tissue is heterogeneously
dense, which may obscure small masses.
FINDINGS: There are no findings suspicious for malignancy. Images were
processed with CAD.
IMPRESSION: No mammographic evidence of malignancy. A result letter of this
screening mammogram will be mailed directly to the patient.

RECOMMENDATION:
Screening mammogram in one year. (Code:[id number]-[id number])

BI-RADS CATEGORY  1: Negative.

## 2021-05-16 DIAGNOSIS — E063 Autoimmune thyroiditis: Secondary | ICD-10-CM | POA: Diagnosis not present

## 2021-06-10 ENCOUNTER — Ambulatory Visit: Payer: Medicare Other | Admitting: Nurse Practitioner

## 2021-06-13 ENCOUNTER — Telehealth: Payer: Self-pay

## 2021-06-13 DIAGNOSIS — Z1231 Encounter for screening mammogram for malignant neoplasm of breast: Secondary | ICD-10-CM

## 2021-06-13 NOTE — Telephone Encounter (Signed)
Mammogram ordered

## 2021-08-05 ENCOUNTER — Telehealth: Payer: Self-pay

## 2021-08-05 NOTE — Telephone Encounter (Signed)
Left vm to confirm 01/05/23appointment-Toni

## 2021-08-07 ENCOUNTER — Ambulatory Visit (INDEPENDENT_AMBULATORY_CARE_PROVIDER_SITE_OTHER): Payer: Medicare Other | Admitting: Nurse Practitioner

## 2021-08-07 ENCOUNTER — Other Ambulatory Visit: Payer: Self-pay

## 2021-08-07 ENCOUNTER — Encounter: Payer: Self-pay | Admitting: Nurse Practitioner

## 2021-08-07 VITALS — BP 120/76 | HR 98 | Temp 98.3°F | Resp 16 | Ht 70.0 in | Wt 137.6 lb

## 2021-08-07 DIAGNOSIS — E559 Vitamin D deficiency, unspecified: Secondary | ICD-10-CM | POA: Diagnosis not present

## 2021-08-07 DIAGNOSIS — Z0130 Encounter for examination of blood pressure without abnormal findings: Secondary | ICD-10-CM

## 2021-08-07 DIAGNOSIS — Z23 Encounter for immunization: Secondary | ICD-10-CM | POA: Diagnosis not present

## 2021-08-07 DIAGNOSIS — N8110 Cystocele, unspecified: Secondary | ICD-10-CM

## 2021-08-07 DIAGNOSIS — Z0001 Encounter for general adult medical examination with abnormal findings: Secondary | ICD-10-CM | POA: Diagnosis not present

## 2021-08-07 DIAGNOSIS — M816 Localized osteoporosis [Lequesne]: Secondary | ICD-10-CM

## 2021-08-07 DIAGNOSIS — E782 Mixed hyperlipidemia: Secondary | ICD-10-CM | POA: Diagnosis not present

## 2021-08-07 DIAGNOSIS — R300 Dysuria: Secondary | ICD-10-CM | POA: Diagnosis not present

## 2021-08-07 MED ORDER — PNEUMOCOCCAL 20-VAL CONJ VACC 0.5 ML IM SUSY: 0.5000 mL | PREFILLED_SYRINGE | INTRAMUSCULAR | 0 refills | Status: AC

## 2021-08-07 NOTE — Progress Notes (Signed)
[facility] [place], [place] [address]  Internal MEDICINE  Office Visit Note  Patient Name: Stephanie Dillon  [id number]  [id number]  Date of Service: 08/07/2021  Chief Complaint  Patient presents with   Medicare Wellness    Discuss prolapsed bladder    HPI Stephanie Dillon presents for an annual well visit and physical exam. She is a well appearing 71 yo female with prolapsed bladder. Her mammogram was ordered in November. Patient was instructed to call [facility].  For her prolapsed bladder, she is wanting to find some other interventions to help improve it without surgery. Interested in pelvic floor physical therapy. Also asking to try conjugated estrogen cream.  Her bone density scan showed osteoporosis of the left forearm. She takes a calcium and vitamin D supplement. Biphosphonate treatment discussed today.  She has aged out for pap smears. She is up to date for routine colonoscopy. She is due for routine labs and her pneumonia vaccine.    Current Medication: Outpatient Encounter Medications as of 08/07/2021  Medication Sig   Biotin 1 MG CAPS Take 1 tablet by mouth daily.   calcium citrate-vitamin D (CITRACAL+D) 315-200 MG-UNIT per tablet Take 1 tablet by mouth daily.   ciclopirox (PENLAC) 8 % solution Apply topically at bedtime. Apply over nail and surrounding skin. Apply daily over previous coat. After seven (7) days, may remove with alcohol and continue cycle.   conjugated estrogens (PREMARIN) vaginal cream Place 1 Applicatorful vaginally daily.   Cyanocobalamin (B-12) 250 MCG TABS Take 250 mcg by mouth daily.   folic acid (FOLVITE) 818 MCG tablet Take 1 tablet by mouth daily.   levothyroxine (SYNTHROID, LEVOTHROID) 50 MCG tablet Take 50 mcg by mouth daily before breakfast.   Multiple Vitamin (MULTIVITAMIN) tablet Take 1 tablet by mouth daily.   [EXPIRED] pneumococcal 20-valent conjugate vaccine (PREVNAR 20) 0.5 ML injection Inject 0.5 mLs into the  muscle tomorrow at 10 am for 1 dose.   vitamin C (ASCORBIC ACID) 500 MG tablet Take 500 mg by mouth daily.   No facility-administered encounter medications on file as of 08/07/2021.    Surgical History: Past Surgical History:  Procedure Laterality Date   TUBAL LIGATION      Medical History: Past Medical History:  Diagnosis Date   Hypothyroid    Thyroid disease     Family History: Family History  Problem Relation Age of Onset   Colon cancer Mother    AAA (abdominal aortic aneurysm) Mother    Breast cancer Neg Hx     Social History   Socioeconomic History   Marital status: Married    Spouse name: Not on file   Number of children: Not on file   Years of education: Not on file   Highest education level: Not on file  Occupational History   Not on file  Tobacco Use   Smoking status: Never   Smokeless tobacco: Never   Tobacco comments:    only tried in high school   Vaping Use   Vaping Use: Never used  Substance and Sexual Activity   Alcohol use: No   Drug use: No   Sexual activity: Not on file  Other Topics Concern   Not on file  Social History Narrative   Not on file   Social Determinants of Health   Financial Resource Strain: Not on file  Food Insecurity: Not on file  Transportation Needs: Not on file  Physical Activity: Not on file  Stress: Not on  file  Social Connections: Not on file  Intimate Partner Violence: Not on file      Review of Systems  Constitutional:  Negative for activity change, appetite change, chills, fatigue, fever and unexpected weight change.  HENT: Negative.  Negative for congestion, ear pain, rhinorrhea, sore throat and trouble swallowing.   Eyes: Negative.   Respiratory: Negative.  Negative for cough, chest tightness, shortness of breath and wheezing.   Cardiovascular: Negative.  Negative for chest pain.  Gastrointestinal: Negative.  Negative for abdominal pain, blood in stool, constipation, diarrhea, nausea and vomiting.   Endocrine: Negative.   Genitourinary: Negative.  Negative for difficulty urinating, dysuria, frequency, hematuria and urgency.  Musculoskeletal: Negative.  Negative for arthralgias, back pain, joint swelling, myalgias and neck pain.  Skin: Negative.  Negative for rash and wound.  Allergic/Immunologic: Negative.  Negative for immunocompromised state.  Neurological: Negative.  Negative for dizziness, seizures, numbness and headaches.  Hematological: Negative.   Psychiatric/Behavioral: Negative.  Negative for behavioral problems, self-injury and suicidal ideas. The patient is not nervous/anxious.    Vital Signs: BP 120/76 Comment: 140/84   Pulse 98    Temp 98.3 F (36.8 C)    Resp 16    Ht _0  (1.778 m)    Wt 137 lb 9.6 oz (62.4 kg)    SpO2 99%    BMI 19.74 kg/m    Physical Exam Vitals reviewed.  Constitutional:      General: She is awake. She is not in acute distress.    Appearance: Normal appearance. She is well-developed and well-groomed. She is obese. She is not ill-appearing or diaphoretic.  HENT:     Head: Normocephalic and atraumatic.     Right Ear: Tympanic membrane, ear canal and external ear normal.     Left Ear: Tympanic membrane, ear canal and external ear normal.     Nose: Nose normal. No congestion or rhinorrhea.     Mouth/Throat:     Lips: Pink.     Mouth: Mucous membranes are moist.     Pharynx: Oropharynx is clear. Uvula midline. No oropharyngeal exudate or posterior oropharyngeal erythema.  Eyes:     General: Lids are normal. Vision grossly intact. Gaze aligned appropriately. No scleral icterus.       Right eye: No discharge.        Left eye: No discharge.     Extraocular Movements: Extraocular movements intact.     Conjunctiva/sclera: Conjunctivae normal.     Pupils: Pupils are equal, round, and reactive to light.     Funduscopic exam:    Right eye: Red reflex present.        Left eye: Red reflex present. Neck:     Thyroid: No thyromegaly.     Vascular:  No JVD.     Trachea: Trachea and phonation normal. No tracheal deviation.  Cardiovascular:     Rate and Rhythm: Normal rate and regular rhythm.     Pulses: Normal pulses.     Heart sounds: Normal heart sounds, S1 normal and S2 normal. No murmur heard.   No friction rub. No gallop.  Pulmonary:     Effort: Pulmonary effort is normal. No accessory muscle usage or respiratory distress.     Breath sounds: Normal breath sounds and air entry. No stridor. No wheezing or rales.  Chest:     Chest wall: No tenderness.     Comments: Declined clinical breast exam, patient will get mammogram done.  Abdominal:     General: Bowel  sounds are normal. There is no distension.     Palpations: Abdomen is soft. There is no shifting dullness, fluid wave, mass or pulsatile mass.     Tenderness: There is no abdominal tenderness. There is no guarding or rebound.  Musculoskeletal:        General: No tenderness or deformity. Normal range of motion.     Cervical back: Normal range of motion and neck supple.     Right lower leg: No edema.     Left lower leg: No edema.  Lymphadenopathy:     Cervical: No cervical adenopathy.  Skin:    General: Skin is warm and dry.     Capillary Refill: Capillary refill takes less than 2 seconds.     Coloration: Skin is not pale.     Findings: No erythema or rash.  Neurological:     Mental Status: She is alert and oriented to person, place, and time.     Cranial Nerves: No cranial nerve deficit.     Motor: No abnormal muscle tone.     Coordination: Coordination normal.     Gait: Gait normal.     Deep Tendon Reflexes: Reflexes are normal and symmetric.  Psychiatric:        Mood and Affect: Mood normal.        Behavior: Behavior normal. Behavior is cooperative.        Thought Content: Thought content normal.        Judgment: Judgment normal.       Assessment/Plan: 1. Encounter for general adult medical examination with abnormal findings Age-appropriate preventive  screenings and vaccinations discussed, annual physical exam completed. Routine labs for health maintenance ordered, see below. PHM updated.  - CBC with Differential/Platelet - CMP14+EGFR  2. Localized osteoporosis without current pathological fracture Routine labs ordered. Discussed biphosphonate treatment, patient wants to think about it and do some research before making a decision.  - CBC with Differential/Platelet - CMP14+EGFR  3. Mixed hyperlipidemia Routine lab ordered - Lipid Profile  4. Vitamin D deficiency Routine lab ordered - Vitamin D (25 hydroxy)  5. Dysuria Routine urinalysis done - UA/M w/rflx Culture, Routine  6. Encounter for examination of blood pressure without abnormal findings Routine lab ordered - CBC with Differential/Platelet  7. Need for vaccination - pneumococcal 20-valent conjugate vaccine (PREVNAR 20) 0.5 ML injection; Inject 0.5 mLs into the muscle tomorrow at 10 am for 1 dose.  Dispense: 0.5 mL; Refill: 0  8. Prolapse of female bladder, acquired Patient looking into local pelvic floor physical therapists and will call clinic if she needs a referral. Vaginal estrogen cream ordered to help improve bladder prolapse potentially.  - conjugated estrogens (PREMARIN) vaginal cream; Place 1 Applicatorful vaginally daily.  Dispense: 42.5 g; Refill: 4     General Counseling: Stephanie Dillon verbalizes understanding of the findings of todays visit and agrees with plan of treatment. I have discussed any further diagnostic evaluation that may be needed or ordered today. We also reviewed her medications today. she has been encouraged to call the office with any questions or concerns that should arise related to todays visit.    Orders Placed This Encounter  Procedures   Microscopic Examination   Urine Culture, Reflex   CBC with Differential/Platelet   CMP14+EGFR   Lipid Profile   Vitamin D (25 hydroxy)   UA/M w/rflx Culture, Routine    Meds ordered this  encounter  Medications   pneumococcal 20-valent conjugate vaccine (PREVNAR 20) 0.5 ML injection  Sig: Inject 0.5 mLs into the muscle tomorrow at 10 am for 1 dose.    Dispense:  0.5 mL    Refill:  0   conjugated estrogens (PREMARIN) vaginal cream    Sig: Place 1 Applicatorful vaginally daily.    Dispense:  42.5 g    Refill:  4    Return in about 1 year (around 08/07/2022) for CPE, Stephanie Dillon PCP.   Total time spent:30 Minutes Time spent includes review of chart, medications, test results, and follow up plan with the patient.   [place] Controlled Substance Database was reviewed by me.  This patient was seen by Jonetta Osgood, FNP-C in collaboration with Dr. Clayborn Bigness as a part of collaborative care agreement.  Stephanie Bautch R. Valetta Fuller, MSN, FNP-C Internal medicine

## 2021-08-08 MED ORDER — ESTROGENS CONJUGATED 0.625 MG/GM VA CREA: 1.0000 | TOPICAL_CREAM | Freq: Every day | VAGINAL | 4 refills | Status: DC

## 2021-08-11 ENCOUNTER — Telehealth: Payer: Self-pay

## 2021-08-12 LAB — UA/M W/RFLX CULTURE, ROUTINE
Bilirubin, UA: NEGATIVE
Glucose, UA: NEGATIVE
Ketones, UA: NEGATIVE
Nitrite, UA: NEGATIVE
Protein,UA: NEGATIVE
RBC, UA: NEGATIVE
Specific Gravity, UA: 1.009 (ref 1.005–1.030)
Urobilinogen, Ur: 0.2 mg/dL (ref 0.2–1.0)
pH, UA: 7 (ref 5.0–7.5)

## 2021-08-12 LAB — MICROSCOPIC EXAMINATION
Casts: NONE SEEN /lpf
Epithelial Cells (non renal): NONE SEEN /hpf (ref 0–10)

## 2021-08-12 LAB — URINE CULTURE, REFLEX

## 2021-08-13 ENCOUNTER — Telehealth: Payer: Self-pay

## 2021-08-13 DIAGNOSIS — N3000 Acute cystitis without hematuria: Secondary | ICD-10-CM

## 2021-08-13 MED ORDER — NITROFURANTOIN MONOHYD MACRO 100 MG PO CAPS
100.0000 mg | ORAL_CAPSULE | Freq: Two times a day (BID) | ORAL | 0 refills | Status: AC
Start: 2021-08-13 — End: 2021-08-20

## 2021-08-13 NOTE — Telephone Encounter (Signed)
-----   Message from Jonetta Osgood, NP sent at 08/13/2021  4:13 AM EST ----- Please call patient nad let her know that her urinalysis showed trace leukocytes and the microscopic examination showed many bacteria. The urine culture came back positive for E.Coli bacteria. Ask her if she I having symptoms of a UTI, I can send an antibiotic in to the pharmacy if she is symptomatic.

## 2021-08-13 NOTE — Progress Notes (Signed)
Please call patient nad let her know that her urinalysis showed trace leukocytes and the microscopic examination showed many bacteria. The urine culture came back positive for E.Coli bacteria. Ask her if she I having symptoms of a UTI, I can send an antibiotic in to the pharmacy if she is symptomatic.

## 2021-08-13 NOTE — Telephone Encounter (Signed)
LMOM that med was sent to pharmacy  

## 2021-08-13 NOTE — Telephone Encounter (Signed)
Spoke to pt and informed her that we sent macrobid to pharmacy.  Pt was having some burning

## 2021-08-13 NOTE — Progress Notes (Signed)
Spoke to pt, provided results. Pt would like antibiotics sent in and thinks macrobid would work well for her. Message sent to [place].

## 2021-08-14 NOTE — Telephone Encounter (Signed)
error 

## 2021-08-14 NOTE — Telephone Encounter (Signed)
Spoke to pt and informed her that she didn't have to do a repeat UA after finishing abx

## 2021-09-02 DIAGNOSIS — H43811 Vitreous degeneration, right eye: Secondary | ICD-10-CM | POA: Diagnosis not present

## 2021-09-05 ENCOUNTER — Ambulatory Visit
Admission: RE | Admit: 2021-09-05 | Discharge: 2021-09-05 | Disposition: A | Discharge status: Home / Self Care | Payer: Medicare Other | Source: Ambulatory Visit | Attending: Nurse Practitioner | Admitting: Nurse Practitioner

## 2021-09-05 ENCOUNTER — Other Ambulatory Visit: Payer: Self-pay

## 2021-09-05 ENCOUNTER — Other Ambulatory Visit
Admission: RE | Admit: 2021-09-05 | Discharge: 2021-09-05 | Disposition: A | Discharge status: Home / Self Care | Payer: Medicare Other | Source: Home / Self Care | Attending: Nurse Practitioner | Admitting: Nurse Practitioner

## 2021-09-05 DIAGNOSIS — E559 Vitamin D deficiency, unspecified: Secondary | ICD-10-CM | POA: Insufficient documentation

## 2021-09-05 DIAGNOSIS — Z1231 Encounter for screening mammogram for malignant neoplasm of breast: Secondary | ICD-10-CM | POA: Insufficient documentation

## 2021-09-05 DIAGNOSIS — M816 Localized osteoporosis [Lequesne]: Secondary | ICD-10-CM | POA: Diagnosis not present

## 2021-09-05 DIAGNOSIS — E782 Mixed hyperlipidemia: Secondary | ICD-10-CM | POA: Diagnosis not present

## 2021-09-05 LAB — COMPREHENSIVE METABOLIC PANEL
ALT: 20 U/L (ref 0–44)
AST: 22 U/L (ref 15–41)
Albumin: 4.3 g/dL (ref 3.5–5.0)
Alkaline Phosphatase: 76 U/L (ref 38–126)
Anion gap: 9 (ref 5–15)
BUN: 10 mg/dL (ref 8–23)
CO2: 28 mmol/L (ref 22–32)
Calcium: 9.3 mg/dL (ref 8.9–10.3)
Chloride: 98 mmol/L (ref 98–111)
Creatinine, Ser: 0.61 mg/dL (ref 0.44–1.00)
GFR, Estimated: >60 mL/min (ref >60)
Glucose, Bld: 99 mg/dL (ref 70–99)
Potassium: 4.2 mmol/L (ref 3.5–5.1)
Sodium: 135 mmol/L (ref 135–145)
Total Bilirubin: 1.4 mg/dL — ABNORMAL HIGH (ref 0.3–1.2)
Total Protein: 7.4 g/dL (ref 6.5–8.1)

## 2021-09-05 LAB — LIPID PANEL
Cholesterol: 244 mg/dL — ABNORMAL HIGH (ref 0–200)
HDL: 84 mg/dL (ref >40)
LDL Cholesterol: 144 mg/dL — ABNORMAL HIGH (ref 0–99)
Total CHOL/HDL Ratio: 2.9 RATIO
Triglycerides: 79 mg/dL (ref <150)
VLDL: 16 mg/dL (ref 0–40)

## 2021-09-05 LAB — CBC WITH DIFFERENTIAL/PLATELET
Abs Immature Granulocytes: 0.02 10*3/uL (ref 0.00–0.07)
Basophils Absolute: 0.1 10*3/uL (ref 0.0–0.1)
Basophils Relative: 1 %
Eosinophils Absolute: 0.3 10*3/uL (ref 0.0–0.5)
Eosinophils Relative: 4 %
HCT: 39.8 % (ref 36.0–46.0)
Hemoglobin: 13.2 g/dL (ref 12.0–15.0)
Immature Granulocytes: 0 %
Lymphocytes Relative: 17 %
Lymphs Abs: 1.2 10*3/uL (ref 0.7–4.0)
MCH: 30.9 pg (ref 26.0–34.0)
MCHC: 33.2 g/dL (ref 30.0–36.0)
MCV: 93.2 fL (ref 80.0–100.0)
Monocytes Absolute: 0.5 10*3/uL (ref 0.1–1.0)
Monocytes Relative: 7 %
Neutro Abs: 4.8 10*3/uL (ref 1.7–7.7)
Neutrophils Relative %: 71 %
Platelets: 218 10*3/uL (ref 150–400)
RBC: 4.27 MIL/uL (ref 3.87–5.11)
RDW: 12.4 % (ref 11.5–15.5)
WBC: 6.9 10*3/uL (ref 4.0–10.5)
nRBC: 0 % (ref 0.0–0.2)

## 2021-09-05 LAB — VITAMIN D 25 HYDROXY (VIT D DEFICIENCY, FRACTURES): Vit D, 25-Hydroxy: 46.54 ng/mL (ref 30–100)

## 2021-09-10 ENCOUNTER — Telehealth: Payer: Self-pay

## 2021-09-10 NOTE — Telephone Encounter (Signed)
Contacted patient about lab results on 09/10/2021.

## 2021-09-10 NOTE — Progress Notes (Signed)
Please call patient and let her know her results: -CBC is normal, no anemia noted. -cholesterol levels are elevated slightly more than last year, total cholesterol is 244 and LDL is 144. Ways to improve this are limiting red meat, increasing lean proteins and adding an OTC fish oil supplement at least 1000 mg daily.  -vitamin D level is normal, if taking an OTC supplement, may continue if desired.  -metabolic panel is normal, except for slightly elevated bilirubin level. This we will recheck in a few months and see if it resolves.

## 2021-09-10 NOTE — Telephone Encounter (Signed)
-----   Message from Jonetta Osgood, NP sent at 09/10/2021  6:01 AM EST ----- Please call patient and let her know her results: -CBC is normal, no anemia noted. -cholesterol levels are elevated slightly more than last year, total cholesterol is 244 and LDL is 144. Ways to improve this are limiting red meat, increasing lean proteins and adding an OTC fish oil supplement at least 1000 mg daily.  -vitamin D level is normal, if taking an OTC supplement, may continue if desired.  -metabolic panel is normal, except for slightly elevated bilirubin level. This we will recheck in a few months and see if it resolves.

## 2021-09-17 ENCOUNTER — Encounter: Payer: Self-pay | Admitting: Internal Medicine

## 2021-09-23 ENCOUNTER — Ambulatory Visit: Payer: Medicare Other | Admitting: Dermatology

## 2021-12-01 ENCOUNTER — Ambulatory Visit (INDEPENDENT_AMBULATORY_CARE_PROVIDER_SITE_OTHER): Payer: Medicare Other | Admitting: Dermatology

## 2021-12-01 DIAGNOSIS — L820 Inflamed seborrheic keratosis: Secondary | ICD-10-CM | POA: Diagnosis not present

## 2021-12-01 DIAGNOSIS — W57XXXA Bitten or stung by nonvenomous insect and other nonvenomous arthropods, initial encounter: Secondary | ICD-10-CM | POA: Diagnosis not present

## 2021-12-01 DIAGNOSIS — B351 Tinea unguium: Secondary | ICD-10-CM

## 2021-12-01 DIAGNOSIS — L814 Other melanin hyperpigmentation: Secondary | ICD-10-CM | POA: Diagnosis not present

## 2021-12-01 DIAGNOSIS — D1800 Hemangioma unspecified site: Secondary | ICD-10-CM

## 2021-12-01 DIAGNOSIS — L821 Other seborrheic keratosis: Secondary | ICD-10-CM

## 2021-12-01 DIAGNOSIS — Z1283 Encounter for screening for malignant neoplasm of skin: Secondary | ICD-10-CM | POA: Diagnosis not present

## 2021-12-01 DIAGNOSIS — S80862A Insect bite (nonvenomous), left lower leg, initial encounter: Secondary | ICD-10-CM | POA: Diagnosis not present

## 2021-12-01 DIAGNOSIS — L720 Epidermal cyst: Secondary | ICD-10-CM

## 2021-12-01 DIAGNOSIS — D229 Melanocytic nevi, unspecified: Secondary | ICD-10-CM

## 2021-12-01 DIAGNOSIS — L603 Nail dystrophy: Secondary | ICD-10-CM

## 2021-12-01 DIAGNOSIS — T148XXA Other injury of unspecified body region, initial encounter: Secondary | ICD-10-CM

## 2021-12-01 DIAGNOSIS — D2261 Melanocytic nevi of right upper limb, including shoulder: Secondary | ICD-10-CM | POA: Diagnosis not present

## 2021-12-01 DIAGNOSIS — L570 Actinic keratosis: Secondary | ICD-10-CM

## 2021-12-01 DIAGNOSIS — L578 Other skin changes due to chronic exposure to nonionizing radiation: Secondary | ICD-10-CM | POA: Diagnosis not present

## 2021-12-01 DIAGNOSIS — L738 Other specified follicular disorders: Secondary | ICD-10-CM

## 2021-12-01 DIAGNOSIS — D2262 Melanocytic nevi of left upper limb, including shoulder: Secondary | ICD-10-CM

## 2021-12-01 MED ORDER — CICLOPIROX 8 % EX SOLN: Freq: Every day | CUTANEOUS | 11 refills | Status: DC

## 2021-12-01 NOTE — Patient Instructions (Addendum)
DermaNail - Apply to base of fingernails twice daily.  ?Biotin - take 2.'5mg'$  daily ? ?Cryotherapy Aftercare ? ?Wash gently with soap and water everyday.   ?Apply Vaseline and Band-Aid daily until healed.  ? ? ?Melanoma ABCDEs ? ?Melanoma is the most dangerous type of skin cancer, and is the leading cause of death from skin disease.  You are more likely to develop melanoma if you: ?Have light-colored skin, light-colored eyes, or red or blond hair ?Spend a lot of time in the sun ?Tan regularly, either outdoors or in a tanning bed ?Have had blistering sunburns, especially during childhood ?Have a close family member who has had a melanoma ?Have atypical moles or large birthmarks ? ?Early detection of melanoma is key since treatment is typically straightforward and cure rates are extremely high if we catch it early.  ? ?The first sign of melanoma is often a change in a mole or a new dark spot.  The ABCDE system is a way of remembering the signs of melanoma. ? ?A for asymmetry:  The two halves do not match. ?B for border:  The edges of the growth are irregular. ?C for color:  A mixture of colors are present instead of an even brown color. ?D for diameter:  Melanomas are usually (but not always) greater than 9m - the size of a pencil eraser. ?E for evolution:  The spot keeps changing in size, shape, and color. ? ?Please check your skin once per month between visits. You can use a small mirror in front and a large mirror behind you to keep an eye on the back side or your body.  ? ?If you see any new or changing lesions before your next follow-up, please call to schedule a visit. ? ?Please continue daily skin protection including broad spectrum sunscreen SPF 30+ to sun-exposed areas, reapplying every 2 hours as needed when you're outdoors.  ? ?Staying in the shade or wearing long sleeves, sun glasses (UVA+UVB protection) and wide brim hats (4-inch brim around the entire circumference of the hat) are also recommended for sun  protection.   ? ? ?If You Need Anything After Your Visit ? ?If you have any questions or concerns for your doctor, please call our main line at [phone] press option 4 to reach your doctor's medical assistant. If no one answers, please leave a voicemail as directed and we will return your call as soon as possible. Messages left after 4 pm will be answered the following business day.  ? ?You may also send [place] message via MyChart. We typically respond to MyChart messages within 1-2 business days. ? ?For prescription refills, please ask your pharmacy to contact our office. Our fax number is [phone] ? ?If you have an urgent issue when the clinic is closed that cannot wait until the next business day, you can page your doctor at the number below.   ? ?Please note that while we do our best to be available for urgent issues outside of office hours, we are not available 24/7.  ? ?If you have an urgent issue and are unable to reach [place] you may choose to seek medical care at your doctor's office, retail clinic, urgent care center, or emergency room. ? ?If you have a medical emergency, please immediately call 911 or go to the emergency department. ? ?Pager Numbers ? ?- Dr. KNehemiah Massed [phone]? ?- Dr. MLaurence Ferrari [phone]? ?- Dr. SNicole Kindred [phone]? ?In the event of inclement weather, please call  our main line at [phone] for an update on the status of any delays or closures. ? ?Dermatology Medication Tips: ?Please keep the boxes that topical medications come in in order to help keep track of the instructions about where and how to use these. Pharmacies typically print the medication instructions only on the boxes and not directly on the medication tubes.  ? ?If your medication is too expensive, please contact our office at [phone] option 4 or send [place] a message through [place].  ? ?We are unable to tell what your co-pay for medications will be in advance as this is different depending on your  insurance coverage. However, we may be able to find a substitute medication at lower cost or fill out paperwork to get insurance to cover a needed medication.  ? ?If a prior authorization is required to get your medication covered by your insurance company, please allow [place] 1-2 business days to complete this process. ? ?Drug prices often vary depending on where the prescription is filled and some pharmacies may offer cheaper prices. ? ?The website www.goodrx.com contains coupons for medications through different pharmacies. The prices here do not account for what the cost may be with help from insurance (it may be cheaper with your insurance), but the website can give you the price if you did not use any insurance.  ?- You can print the associated coupon and take it with your prescription to the pharmacy.  ?- You may also stop by our office during regular business hours and pick up a GoodRx coupon card.  ?- If you need your prescription sent electronically to a different pharmacy, notify our office through [facility] or by phone at [phone] option 4. ? ? ? ? ?Si Usted Necesita Algo Despu?s de Su Visita ? ?Tambi?n puede enviarnos un mensaje a trav?s de MyChart. Por lo general respondemos a los mensajes de MyChart en el transcurso de 1 a 2 d?as h?biles. ? ?Para renovar recetas, por favor pida a su farmacia que se ponga en contacto con nuestra oficina. Nuestro n?mero de fax es el [phone]. ? ?Si tiene un asunto urgente cuando la cl?nica est? cerrada y que no puede esperar hasta el siguiente d?a h?bil, puede llamar/localizar a su doctor(a) al n?mero que aparece a continuaci?n.  ? ?Por favor, tenga en cuenta que aunque hacemos todo lo posible para estar disponibles para asuntos urgentes fuera del horario de oficina, no estamos disponibles las 24 horas del d?a, los 7 d?as de la semana.  ? ?Si tiene un problema urgente y no puede comunicarse con nosotros, puede optar por buscar atenci?n m?dica  en el  consultorio de su doctor(a), en una cl?nica privada, en un centro de atenci?n urgente o en una sala de emergencias. ? ?Si tiene Engineer, maintenance (IT) m?dica, por favor llame inmediatamente al 911 o vaya a la sala de emergencias. ? ?N?meros de b?per ? ?- Dr. Nehemiah Massed: [phone] ? ?- Dra. Moye: [phone] ? ?- Dra. Nicole Kindred: [phone] ? ?En caso de inclemencias del tiempo, por favor llame a nuestra l?nea principal al [phone] para una actualizaci?n sobre el estado de cualquier retraso o cierre. ? ?Consejos para la medicaci?n en dermatolog?a: ?Por favor, guarde las cajas en las que vienen los medicamentos de uso t?pico para ayudarle a seguir las instrucciones sobre d?nde y c?mo usarlos. Las farmacias generalmente imprimen las instrucciones del medicamento s?lo en las cajas y no directamente en los tubos del [place].  ? ?Si su medicamento es [employer], por favor,  p?ngase en contacto con nuestra oficina llamando al [phone] y presione la opci?n 4 o env?enos un mensaje a trav?s de MyChart.  ? ?No podemos decirle cu?l ser? su copago por los medicamentos por adelantado ya que esto es diferente dependiendo de la cobertura de su seguro. Sin embargo, es posible que podamos encontrar un medicamento sustituto a Electrical engineer un formulario para que el seguro cubra el medicamento que se considera necesario.  ? ?Si se requiere Ardelia Mems autorizaci?n previa para que su compa??a de seguros [place] su medicamento, por favor perm?tanos de 1 a 2 d?as h?biles para completar este proceso. ? ?Los precios de los medicamentos var?an con frecuencia dependiendo del Environmental consultant de d?nde se surte la receta y alguna farmacias pueden ofrecer precios m?s baratos. ? ?El sitio web www.goodrx.com tiene cupones para medicamentos de Airline pilot. Los precios aqu? no tienen en cuenta lo que podr?a costar con la ayuda del seguro (puede ser m?s barato con su seguro), pero el sitio web puede darle el precio si no utiliz? ning?n seguro.  ?- Puede  imprimir el cup?n correspondiente y llevarlo con su receta a la farmacia.  ?- Tambi?n puede pasar por nuestra oficina durante el horario de atenci?n regular y recoger una tarjeta de cupones de GoodRx.  ?- Si

## 2021-12-01 NOTE — Progress Notes (Signed)
? ?Follow-Up Visit ?  ?Subjective  ?Stephanie Dillon is a 71 y.o. female who presents for the following: Annual Exam. ? ?The patient presents for Total-Body Skin Exam (TBSE) for skin cancer screening and mole check.  The patient has spots, moles and lesions to be evaluated, some may be new or changing.  ? ?The following portions of the chart were reviewed this encounter and updated as appropriate:  ?  ?  ? ?Review of Systems:  No other skin or systemic complaints except as noted in HPI or Assessment and Plan. ? ?Objective  ?Well appearing patient in no apparent distress; mood and affect are within normal limits. ? ?A full examination was performed including scalp, head, eyes, ears, nose, lips, neck, chest, axillae, abdomen, back, buttocks, bilateral upper extremities, bilateral lower extremities, hands, feet, fingers, toes, fingernails, and toenails. All findings within normal limits unless otherwise noted below. ? ?right upper arm; left upper arm ?right upper arm: ?3 mm speckled pink brown papule- stable ?  ?Left Upper Arm: ?1 mm brown papule adjacent 37m light brown macule on left upper arm- stable ? ?Right Upper Arm ?Light Pink crusted papule c/w Excoriation  ? ?Right Posterior Upper Arm ?Erythematous stuck-on, waxy papule ? ?Left Popliteal ?Pink edematous papule ? ?L temple x 1, L medial cheek x 1 (2) ?Pink scaly macule ? ?Right Hallux Toe Nail Plate ?Nail appears clear ? ?Finger Nail Plate ?Mild ridging and dryness ? ? ? ?Assessment & Plan  ?Skin cancer screening performed today. ?Actinic Damage ?- chronic, secondary to cumulative UV radiation exposure/sun exposure over time ?- diffuse scaly erythematous macules with underlying dyspigmentation ?- Recommend daily broad spectrum sunscreen SPF 30+ to sun-exposed areas, reapply every 2 hours as needed.  ?- Recommend staying in the shade or wearing long sleeves, sun glasses (UVA+UVB protection) and wide brim hats (4-inch brim around the entire circumference of the  hat). ?- Call for new or changing lesions. ? ?Lentigines ?- Scattered tan macules ?- Due to sun exposure ?- Benign-appering, observe ?- Recommend daily broad spectrum sunscreen SPF 30+ to sun-exposed areas, reapply every 2 hours as needed. ?- Call for any changes ? ?Seborrheic Keratoses ?- Stuck-on, waxy, tan-brown papules and/or plaques  ?- Benign-appearing ?- Discussed benign etiology and prognosis. ?- Observe ?- Call for any changes ? ?Melanocytic Nevi ?- Tan-brown and/or pink-flesh-colored symmetric macules and papules ?- Benign appearing on exam today ?- Observation ?- Call clinic for new or changing moles ?- Recommend daily use of broad spectrum spf 30+ sunscreen to sun-exposed areas.  ? ?Milia ?- tiny firm white papules ?- type of cyst ?- benign ?- may be extracted if symptomatic ?- observe ? ?Sebaceous Hyperplasia ?- Small yellow papules with a central dell ?- Benign ?- Observe ? ?Nevus ?right upper arm; left upper arm ? ?Benign-appearing.  Stable compared to previous exam and photos. Observation.  Call clinic for new or changing moles.  Recommend daily use of broad spectrum spf 30+ sunscreen to sun-exposed areas.  ? ?Excoriation ?Right Upper Arm ? ?Healing. Observation. ? ?Inflamed seborrheic keratosis ?Right Posterior Upper Arm ? ?Destruction of lesion - Right Posterior Upper Arm ? ?Destruction method: cryotherapy   ?Informed consent: discussed and consent obtained   ?Lesion destroyed using liquid nitrogen: Yes   ?Region frozen until ice ball extended beyond lesion: Yes   ?Outcome: patient tolerated procedure well with no complications   ?Post-procedure details: wound care instructions given   ?Additional details:  Prior to procedure, discussed risks of blister formation, small  wound, skin dyspigmentation, or rare scar following cryotherapy. Recommend Vaseline ointment to treated areas while healing. ? ? ?Bug bite without infection, initial encounter ?Left Popliteal ? ?Not bothersome to patient.   Observation.  ? ?AK (actinic keratosis) (2) ?L temple x 1, L medial cheek x 1 ? ?Actinic keratoses are precancerous spots that appear secondary to cumulative UV radiation exposure/sun exposure over time. They are chronic with expected duration over 1 year. A portion of actinic keratoses will progress to squamous cell carcinoma of the skin. It is not possible to reliably predict which spots will progress to skin cancer and so treatment is recommended to prevent development of skin cancer. ? ?Recommend daily broad spectrum sunscreen SPF 30+ to sun-exposed areas, reapply every 2 hours as needed.  ?Recommend staying in the shade or wearing long sleeves, sun glasses (UVA+UVB protection) and wide brim hats (4-inch brim around the entire circumference of the hat). ?Call for new or changing lesions. ? ?Destruction of lesion - L temple x 1, L medial cheek x 1 ? ?Destruction method: cryotherapy   ?Informed consent: discussed and consent obtained   ?Lesion destroyed using liquid nitrogen: Yes   ?Region frozen until ice ball extended beyond lesion: Yes   ?Outcome: patient tolerated procedure well with no complications   ?Post-procedure details: wound care instructions given   ?Additional details:  Prior to procedure, discussed risks of blister formation, small wound, skin dyspigmentation, or rare scar following cryotherapy. Recommend Vaseline ointment to treated areas while healing. ? ? ?Tinea unguium ?Right Hallux Toe Nail Plate ? ?Much improved ? ?Pt wants to continue topical treatment, ciclopirox nail lacquer qhs ? ?ciclopirox (PENLAC) 8 % solution - Right Hallux Toe Nail Plate ?Apply topically at bedtime. Apply over nail and surrounding skin. Apply daily over previous coat. After seven (7) days, may remove with alcohol and continue cycle. ? ?Brittle nails ?Finger Nail Plate ? ?Start DermaNail Nail Conditioner Apply to base of nails bid. ?Increase Biotin to 2.'5mg'$  PO daily. ?Start Cutemol cream or equivalent to fingernails  after hand washing ? ? ?Return in about 1 year (around 12/02/2022) for TBSE. ? ?I, Jamesetta Orleans, CMA, am acting as scribe for Brendolyn Patty, MD . ?Documentation: I have reviewed the above documentation for accuracy and completeness, and I agree with the above. ? ?Brendolyn Patty MD  ? ?

## 2022-04-24 DIAGNOSIS — N3281 Overactive bladder: Secondary | ICD-10-CM | POA: Diagnosis not present

## 2022-04-24 DIAGNOSIS — N816 Rectocele: Secondary | ICD-10-CM | POA: Diagnosis not present

## 2022-04-24 DIAGNOSIS — N8110 Cystocele, unspecified: Secondary | ICD-10-CM | POA: Diagnosis not present

## 2022-05-19 DIAGNOSIS — E063 Autoimmune thyroiditis: Secondary | ICD-10-CM | POA: Diagnosis not present

## 2022-05-19 DIAGNOSIS — Z23 Encounter for immunization: Secondary | ICD-10-CM | POA: Diagnosis not present

## 2022-07-07 DIAGNOSIS — N8110 Cystocele, unspecified: Secondary | ICD-10-CM | POA: Diagnosis not present

## 2022-07-30 ENCOUNTER — Ambulatory Visit: Payer: Medicare Other | Admitting: Nurse Practitioner

## 2022-08-04 ENCOUNTER — Encounter: Payer: Self-pay | Admitting: Nurse Practitioner

## 2022-08-04 ENCOUNTER — Other Ambulatory Visit: Payer: Self-pay | Admitting: Nurse Practitioner

## 2022-08-04 DIAGNOSIS — Z1231 Encounter for screening mammogram for malignant neoplasm of breast: Secondary | ICD-10-CM

## 2022-08-05 ENCOUNTER — Telehealth: Payer: Self-pay | Admitting: Nurse Practitioner

## 2022-08-05 NOTE — Telephone Encounter (Signed)
Left vm to confirm 08/10/2022 appointment-Toni

## 2022-08-10 ENCOUNTER — Encounter: Payer: Self-pay | Admitting: Nurse Practitioner

## 2022-08-10 ENCOUNTER — Ambulatory Visit (INDEPENDENT_AMBULATORY_CARE_PROVIDER_SITE_OTHER): Payer: Medicare Other | Admitting: Nurse Practitioner

## 2022-08-10 VITALS — BP 128/78 | HR 70 | Temp 97.6°F | Resp 16 | Ht 70.0 in | Wt 135.2 lb

## 2022-08-10 DIAGNOSIS — Z0001 Encounter for general adult medical examination with abnormal findings: Secondary | ICD-10-CM

## 2022-08-10 DIAGNOSIS — E782 Mixed hyperlipidemia: Secondary | ICD-10-CM | POA: Diagnosis not present

## 2022-08-10 DIAGNOSIS — M816 Localized osteoporosis [Lequesne]: Secondary | ICD-10-CM | POA: Diagnosis not present

## 2022-08-10 DIAGNOSIS — R300 Dysuria: Secondary | ICD-10-CM | POA: Diagnosis not present

## 2022-08-10 DIAGNOSIS — E559 Vitamin D deficiency, unspecified: Secondary | ICD-10-CM | POA: Diagnosis not present

## 2022-08-10 NOTE — Progress Notes (Signed)
[facility] [place], [place] [address]  Internal MEDICINE  Office Visit Note  Patient Name: Stephanie Dillon  [id number]  [id number]  Date of Service: 08/10/2022  Chief Complaint  Patient presents with   Medicare Wellness    HPI Stephanie Dillon presents for an annual well visit and physical exam.  Well-appearing 72 y.o. female with osteoporosis and bladder prolapse Routine CRC screening: due in 2026 Routine mammogram: scheduled for 09/08/22 DEXA scan: done in December 2021 Labs: due for routine labs  New or worsening pain: none Other concerns: mild joint pains arthritis  Fitted with pessary for bladder prolapse and is also doing exercises, doing much better, no further issues.      08/10/2022    9:51 AM 08/07/2021   11:14 AM 06/07/2020    9:09 AM  MMSE - Mini Mental State Exam  Orientation to time '5 5 5  '$ Orientation to Place '5 5 5  '$ Registration '3 3 3  '$ Attention/ Calculation '5 5 5  '$ Recall '3 3 3  '$ Language- name 2 objects '2 2 2  '$ Language- repeat '1 1 1  '$ Language- follow 3 step command '3 3 3  '$ Language- read & follow direction '1 1 1  '$ Write a sentence '1 1 1  '$ Copy design '1 1 1  '$ Total score '30 30 30    '$ Functional Status Survey: Is the patient deaf or have difficulty hearing?: No Does the patient have difficulty seeing, even when wearing glasses/contacts?: No Does the patient have difficulty concentrating, remembering, or making decisions?: No Does the patient have difficulty walking or climbing stairs?: No Does the patient have difficulty dressing or bathing?: No Does the patient have difficulty doing errands alone such as visiting a doctor's office or shopping?: No     06/07/2019    8:34 AM 06/07/2020    9:08 AM 08/07/2021   11:08 AM 08/07/2021   11:09 AM 08/10/2022    9:49 AM  Fall Risk  Falls in the past year? 0 0 1 1 0  Was there an injury with Fall?   0 0 0  Fall Risk Category Calculator   1 1 0  Fall Risk Category   Low Low Low  Patient Fall Risk Level    Low fall risk Low fall risk Low fall risk  Patient at Risk for Falls Due to  No Fall Risks   No Fall Risks  Fall risk Follow up  Falls evaluation completed Falls evaluation completed Falls evaluation completed Falls evaluation completed       08/10/2022    9:49 AM  Depression screen PHQ 2/9  Decreased Interest 0  Down, Depressed, Hopeless 0  PHQ - 2 Score 0      Current Medication: Outpatient Encounter Medications as of 08/10/2022  Medication Sig   Biotin 1 MG CAPS Take 1 tablet by mouth daily.   calcium citrate-vitamin D (CITRACAL+D) 315-200 MG-UNIT per tablet Take 1 tablet by mouth daily.   ciclopirox (PENLAC) 8 % solution Apply topically at bedtime. Apply over nail and surrounding skin. Apply daily over previous coat. After seven (7) days, may remove with alcohol and continue cycle.   Cyanocobalamin (B-12) 250 MCG TABS Take 250 mcg by mouth daily.   folic acid (FOLVITE) 703 MCG tablet Take 1 tablet by mouth daily.   levothyroxine (SYNTHROID, LEVOTHROID) 50 MCG tablet Take 50 mcg by mouth daily before breakfast.   Multiple Vitamin (MULTIVITAMIN) tablet Take 1 tablet by mouth daily.   vitamin C (ASCORBIC ACID)  500 MG tablet Take 500 mg by mouth daily.   [DISCONTINUED] ciclopirox (PENLAC) 8 % solution Apply topically at bedtime. Apply over nail and surrounding skin. Apply daily over previous coat. After seven (7) days, may remove with alcohol and continue cycle.   [DISCONTINUED] conjugated estrogens (PREMARIN) vaginal cream Place 1 Applicatorful vaginally daily.   No facility-administered encounter medications on file as of 08/10/2022.    Surgical History: Past Surgical History:  Procedure Laterality Date   TUBAL LIGATION      Medical History: Past Medical History:  Diagnosis Date   Hypothyroid    Thyroid disease     Family History: Family History  Problem Relation Age of Onset   Colon cancer Mother    AAA (abdominal aortic aneurysm) Mother    Breast cancer Neg Hx      Social History   Socioeconomic History   Marital status: Married    Spouse name: Not on file   Number of children: Not on file   Years of education: Not on file   Highest education level: Not on file  Occupational History   Not on file  Tobacco Use   Smoking status: Never   Smokeless tobacco: Never   Tobacco comments:    only tried in high school   Vaping Use   Vaping Use: Never used  Substance and Sexual Activity   Alcohol use: No   Drug use: No   Sexual activity: Not on file  Other Topics Concern   Not on file  Social History Narrative   Not on file   Social Determinants of Health   Financial Resource Strain: Not on file  Food Insecurity: Not on file  Transportation Needs: Not on file  Physical Activity: Not on file  Stress: Not on file  Social Connections: Not on file  Intimate Partner Violence: Not on file      Review of Systems  Constitutional:  Negative for activity change, appetite change, chills, fatigue, fever and unexpected weight change.  HENT: Negative.  Negative for congestion, ear pain, rhinorrhea, sore throat and trouble swallowing.   Eyes: Negative.   Respiratory: Negative.  Negative for cough, chest tightness, shortness of breath and wheezing.   Cardiovascular: Negative.  Negative for chest pain.  Gastrointestinal: Negative.  Negative for abdominal pain, blood in stool, constipation, diarrhea, nausea and vomiting.  Endocrine: Negative.   Genitourinary: Negative.  Negative for difficulty urinating, dysuria, frequency, hematuria and urgency.  Musculoskeletal: Negative.  Negative for arthralgias, back pain, joint swelling, myalgias and neck pain.  Skin: Negative.  Negative for rash and wound.  Allergic/Immunologic: Negative.  Negative for immunocompromised state.  Neurological: Negative.  Negative for dizziness, seizures, numbness and headaches.  Hematological: Negative.   Psychiatric/Behavioral: Negative.  Negative for behavioral problems,  self-injury and suicidal ideas. The patient is not nervous/anxious.     Vital Signs: BP 128/78   Pulse 70   Temp 97.6 F (36.4 C)   Resp 16   Ht '5\' 10"'$  (1.778 m)   Wt 135 lb 3.2 oz (61.3 kg)   SpO2 98%   BMI 19.40 kg/m    Physical Exam Vitals reviewed.  Constitutional:      General: She is awake. She is not in acute distress.    Appearance: Normal appearance. She is well-developed and well-groomed. She is obese. She is not ill-appearing or diaphoretic.  HENT:     Head: Normocephalic and atraumatic.     Right Ear: Tympanic membrane, ear canal and external ear normal.  Left Ear: Tympanic membrane, ear canal and external ear normal.     Nose: Nose normal. No congestion or rhinorrhea.     Mouth/Throat:     Lips: Pink.     Mouth: Mucous membranes are moist.     Pharynx: Oropharynx is clear. Uvula midline. No oropharyngeal exudate or posterior oropharyngeal erythema.  Eyes:     General: Lids are normal. Vision grossly intact. Gaze aligned appropriately. No scleral icterus.       Right eye: No discharge.        Left eye: No discharge.     Extraocular Movements: Extraocular movements intact.     Conjunctiva/sclera: Conjunctivae normal.     Pupils: Pupils are equal, round, and reactive to light.     Funduscopic exam:    Right eye: Red reflex present.        Left eye: Red reflex present. Neck:     Thyroid: No thyromegaly.     Vascular: No JVD.     Trachea: Trachea and phonation normal. No tracheal deviation.  Cardiovascular:     Rate and Rhythm: Normal rate and regular rhythm.     Pulses: Normal pulses.     Heart sounds: Normal heart sounds, S1 normal and S2 normal. No murmur heard.    No friction rub. No gallop.  Pulmonary:     Effort: Pulmonary effort is normal. No accessory muscle usage or respiratory distress.     Breath sounds: Normal breath sounds and air entry. No stridor. No wheezing or rales.  Chest:     Chest wall: No tenderness.     Comments: Declined  clinical breast exam, patient will get mammogram done.  Abdominal:     General: Bowel sounds are normal. There is no distension.     Palpations: Abdomen is soft. There is no shifting dullness, fluid wave, mass or pulsatile mass.     Tenderness: There is no abdominal tenderness. There is no guarding or rebound.  Musculoskeletal:        General: No tenderness or deformity. Normal range of motion.     Cervical back: Normal range of motion and neck supple.     Right lower leg: No edema.     Left lower leg: No edema.  Lymphadenopathy:     Cervical: No cervical adenopathy.  Skin:    General: Skin is warm and dry.     Capillary Refill: Capillary refill takes less than 2 seconds.     Coloration: Skin is not pale.     Findings: No erythema or rash.  Neurological:     Mental Status: She is alert and oriented to person, place, and time.     Cranial Nerves: No cranial nerve deficit.     Motor: No abnormal muscle tone.     Coordination: Coordination normal.     Gait: Gait normal.     Deep Tendon Reflexes: Reflexes are normal and symmetric.  Psychiatric:        Mood and Affect: Mood normal.        Behavior: Behavior normal. Behavior is cooperative.        Thought Content: Thought content normal.        Judgment: Judgment normal.        Assessment/Plan: 1. Encounter for general adult medical examination with abnormal findings Age-appropriate preventive screenings and vaccinations discussed, annual physical exam completed. Routine labs for health maintenance ordered, see below. PHM updated.  - CBC with Differential/Platelet - CMP14+EGFR - Lipid Profile - Vitamin  D (25 hydroxy)  2. Localized osteoporosis without current pathological fracture DEXA scan ordered, patient will call to get scheduled with her mammogram in february - DG Bone Density; Future  3. Vitamin D deficiency Routine lab ordered - Vitamin D (25 hydroxy)  4. Mixed hyperlipidemia Routine labs ordered - CBC with  Differential/Platelet - CMP14+EGFR - Lipid Profile  5. Dysuria Routine urinalysis done - UA/M w/rflx Culture, Routine      General Counseling: Benedetta verbalizes understanding of the findings of todays visit and agrees with plan of treatment. I have discussed any further diagnostic evaluation that may be needed or ordered today. We also reviewed her medications today. she has been encouraged to call the office with any questions or concerns that should arise related to todays visit.    Orders Placed This Encounter  Procedures   DG Bone Density   UA/M w/rflx Culture, Routine   CBC with Differential/Platelet   CMP14+EGFR   Lipid Profile   Vitamin D (25 hydroxy)    No orders of the defined types were placed in this encounter.   Return in about 1 year (around 08/11/2023) for CPE, [place] PCP.   Total time spent:30 Minutes Time spent includes review of chart, medications, test results, and follow up plan with the patient.   [place] Controlled Substance Database was reviewed by me.  This patient was seen by Jonetta Osgood, FNP-C in collaboration with Dr. Clayborn Bigness as a part of collaborative care agreement.  Esperansa Sarabia R. Valetta Fuller, MSN, FNP-C Internal medicine

## 2022-08-13 LAB — MICROSCOPIC EXAMINATION
Bacteria, UA: NONE SEEN
Casts: NONE SEEN /lpf
Epithelial Cells (non renal): NONE SEEN /hpf (ref 0–10)
RBC, Urine: NONE SEEN /hpf (ref 0–2)

## 2022-08-13 LAB — UA/M W/RFLX CULTURE, ROUTINE
Bilirubin, UA: NEGATIVE
Glucose, UA: NEGATIVE
Ketones, UA: NEGATIVE
Nitrite, UA: NEGATIVE
Protein,UA: NEGATIVE
RBC, UA: NEGATIVE
Specific Gravity, UA: 1.008 (ref 1.005–1.030)
Urobilinogen, Ur: 0.2 mg/dL (ref 0.2–1.0)
pH, UA: 8 — ABNORMAL HIGH (ref 5.0–7.5)

## 2022-08-13 LAB — URINE CULTURE, REFLEX

## 2022-09-08 ENCOUNTER — Ambulatory Visit
Admission: RE | Admit: 2022-09-08 | Discharge: 2022-09-08 | Disposition: A | Discharge status: Home / Self Care | Payer: Medicare Other | Source: Ambulatory Visit | Attending: Nurse Practitioner | Admitting: Nurse Practitioner

## 2022-09-08 ENCOUNTER — Other Ambulatory Visit
Admission: RE | Admit: 2022-09-08 | Discharge: 2022-09-08 | Disposition: A | Discharge status: Home / Self Care | Payer: Medicare Other | Source: Ambulatory Visit | Attending: Nurse Practitioner | Admitting: Nurse Practitioner

## 2022-09-08 DIAGNOSIS — E559 Vitamin D deficiency, unspecified: Secondary | ICD-10-CM | POA: Insufficient documentation

## 2022-09-08 DIAGNOSIS — E782 Mixed hyperlipidemia: Secondary | ICD-10-CM | POA: Diagnosis not present

## 2022-09-08 DIAGNOSIS — Z1231 Encounter for screening mammogram for malignant neoplasm of breast: Secondary | ICD-10-CM

## 2022-09-08 LAB — CBC WITH DIFFERENTIAL/PLATELET
Abs Immature Granulocytes: 0 10*3/uL (ref 0.00–0.07)
Basophils Absolute: 0.1 10*3/uL (ref 0.0–0.1)
Basophils Relative: 1 %
Eosinophils Absolute: 0.1 10*3/uL (ref 0.0–0.5)
Eosinophils Relative: 2 %
HCT: 40.1 % (ref 36.0–46.0)
Hemoglobin: 13.5 g/dL (ref 12.0–15.0)
Immature Granulocytes: 0 %
Lymphocytes Relative: 28 %
Lymphs Abs: 1.3 10*3/uL (ref 0.7–4.0)
MCH: 30.9 pg (ref 26.0–34.0)
MCHC: 33.7 g/dL (ref 30.0–36.0)
MCV: 91.8 fL (ref 80.0–100.0)
Monocytes Absolute: 0.4 10*3/uL (ref 0.1–1.0)
Monocytes Relative: 7 %
Neutro Abs: 3 10*3/uL (ref 1.7–7.7)
Neutrophils Relative %: 62 %
Platelets: 211 10*3/uL (ref 150–400)
RBC: 4.37 MIL/uL (ref 3.87–5.11)
RDW: 12.7 % (ref 11.5–15.5)
WBC: 4.8 10*3/uL (ref 4.0–10.5)
nRBC: 0 % (ref 0.0–0.2)

## 2022-09-08 LAB — COMPREHENSIVE METABOLIC PANEL
ALT: 18 U/L (ref 0–44)
AST: 24 U/L (ref 15–41)
Albumin: 4.3 g/dL (ref 3.5–5.0)
Alkaline Phosphatase: 64 U/L (ref 38–126)
Anion gap: 9 (ref 5–15)
BUN: 15 mg/dL (ref 8–23)
CO2: 26 mmol/L (ref 22–32)
Calcium: 9.1 mg/dL (ref 8.9–10.3)
Chloride: 100 mmol/L (ref 98–111)
Creatinine, Ser: 0.64 mg/dL (ref 0.44–1.00)
GFR, Estimated: >60 mL/min (ref >60)
Glucose, Bld: 97 mg/dL (ref 70–99)
Potassium: 5.1 mmol/L (ref 3.5–5.1)
Sodium: 135 mmol/L (ref 135–145)
Total Bilirubin: 1.6 mg/dL — ABNORMAL HIGH (ref 0.3–1.2)
Total Protein: 7.3 g/dL (ref 6.5–8.1)

## 2022-09-08 LAB — LIPID PANEL
Cholesterol: 235 mg/dL — ABNORMAL HIGH (ref 0–200)
HDL: 96 mg/dL (ref >40)
LDL Cholesterol: 131 mg/dL — ABNORMAL HIGH (ref 0–99)
Total CHOL/HDL Ratio: 2.4 RATIO
Triglycerides: 42 mg/dL (ref <150)
VLDL: 8 mg/dL (ref 0–40)

## 2022-09-09 LAB — MISC LABCORP TEST (SEND OUT): Labcorp test code: 81950

## 2022-09-17 ENCOUNTER — Telehealth: Payer: Self-pay

## 2022-09-17 NOTE — Telephone Encounter (Signed)
-----   Message from Jonetta Osgood, NP sent at 09/17/2022  8:59 AM EST ----- Vitamin D level is normal as well

## 2022-09-17 NOTE — Progress Notes (Signed)
Vitamin D level is normal as well

## 2022-09-17 NOTE — Telephone Encounter (Signed)
Sent result note

## 2022-09-17 NOTE — Progress Notes (Signed)
Please call with results: --CBC is normal --cmp is normal except for a persistently elevated total bilirubin -- may be a good idea to have an ultrasound of the liver done, I can order this if patient is agreeable.  --cholesterol is improving -- LDL still elevated but lower than previous. Continue current diet and lifestyle. May add fish oil or flaxseed oil supplement if not already taking one.

## 2022-09-17 NOTE — Telephone Encounter (Signed)
Pt.notified

## 2022-09-17 NOTE — Telephone Encounter (Signed)
-----   Message from Jonetta Osgood, NP sent at 09/17/2022  8:58 AM EST ----- Please call with results: --CBC is normal --cmp is normal except for a persistently elevated total bilirubin -- may be a good idea to have an ultrasound of the liver done, I can order this if patient is agreeable.  --cholesterol is improving -- LDL still elevated but lower than previous. Continue current diet and lifestyle. May add fish oil or flaxseed oil supplement if not already taking one.

## 2022-09-17 NOTE — Telephone Encounter (Signed)
Pt advised for labs and send message to alyssa for U/s

## 2022-09-21 ENCOUNTER — Other Ambulatory Visit: Payer: Self-pay | Admitting: Nurse Practitioner

## 2022-09-21 DIAGNOSIS — R748 Abnormal levels of other serum enzymes: Secondary | ICD-10-CM

## 2022-09-22 NOTE — Progress Notes (Signed)
Spoke with pt that we ordered U/s she said they already schedule her

## 2022-09-29 ENCOUNTER — Ambulatory Visit
Admission: RE | Admit: 2022-09-29 | Discharge: 2022-09-29 | Disposition: A | Discharge status: Home / Self Care | Payer: Medicare Other | Source: Ambulatory Visit | Attending: Nurse Practitioner | Admitting: Nurse Practitioner

## 2022-09-29 DIAGNOSIS — M816 Localized osteoporosis [Lequesne]: Secondary | ICD-10-CM

## 2022-09-29 DIAGNOSIS — M810 Age-related osteoporosis without current pathological fracture: Secondary | ICD-10-CM | POA: Diagnosis not present

## 2022-10-01 ENCOUNTER — Ambulatory Visit
Admission: RE | Admit: 2022-10-01 | Discharge: 2022-10-01 | Disposition: A | Discharge status: Home / Self Care | Payer: Medicare Other | Source: Ambulatory Visit | Attending: Nurse Practitioner | Admitting: Nurse Practitioner

## 2022-10-01 ENCOUNTER — Telehealth: Payer: Self-pay | Admitting: Nurse Practitioner

## 2022-10-01 ENCOUNTER — Telehealth: Payer: Self-pay

## 2022-10-01 DIAGNOSIS — R945 Abnormal results of liver function studies: Secondary | ICD-10-CM | POA: Diagnosis not present

## 2022-10-01 DIAGNOSIS — R160 Hepatomegaly, not elsewhere classified: Secondary | ICD-10-CM

## 2022-10-01 DIAGNOSIS — R748 Abnormal levels of other serum enzymes: Secondary | ICD-10-CM

## 2022-10-01 DIAGNOSIS — K769 Liver disease, unspecified: Secondary | ICD-10-CM

## 2022-10-01 NOTE — Telephone Encounter (Signed)
I called the patient to discuss the follow results but she did not answer. I left a voice mail and call back number, will call her again later.   RUQ ultrasound  IMPRESSION: 1. There is suggestion of a 2.3 cm hypoechoic right hepatic lobe mass. Recommend further evaluation with pre and post contrast-enhanced abdominal MRI. 2. Mild increased hepatic parenchymal echogenicity suggestive of hepatic steatosis. 3. No cholelithiasis or sonographic evidence for acute cholecystitis. 4. These results will be called to the ordering clinician or representative by the Radiologist Assistant, and communication documented in the PACS or [employer].  Right hepatic lobe mass size: 2.3 x 2.0 x 1.9 cm

## 2022-10-02 ENCOUNTER — Telehealth: Payer: Self-pay

## 2022-10-02 NOTE — Telephone Encounter (Signed)
done 

## 2022-10-02 NOTE — Telephone Encounter (Signed)
Pt notified for Stephanie Dillon

## 2022-10-13 ENCOUNTER — Telehealth: Payer: Self-pay

## 2022-10-14 ENCOUNTER — Telehealth: Payer: Self-pay | Admitting: Nurse Practitioner

## 2022-10-14 ENCOUNTER — Telehealth: Payer: Self-pay

## 2022-10-14 NOTE — Telephone Encounter (Signed)
Left message for patient to give office a call back.  

## 2022-10-14 NOTE — Telephone Encounter (Signed)
Lvm regarding MRI-Toni

## 2022-10-14 NOTE — Telephone Encounter (Signed)
Stephanie Dillon, can you later her know that they should call her this week to schedule the MRI, we are working on the approval process with her insurance.

## 2022-10-14 NOTE — Addendum Note (Signed)
Addended by: Jonetta Osgood on: 10/14/2022 08:28 AM   Modules accepted: Orders

## 2022-10-20 DIAGNOSIS — Z4689 Encounter for fitting and adjustment of other specified devices: Secondary | ICD-10-CM | POA: Diagnosis not present

## 2022-10-20 DIAGNOSIS — N8110 Cystocele, unspecified: Secondary | ICD-10-CM | POA: Diagnosis not present

## 2022-10-20 DIAGNOSIS — N816 Rectocele: Secondary | ICD-10-CM | POA: Diagnosis not present

## 2022-10-20 DIAGNOSIS — N3281 Overactive bladder: Secondary | ICD-10-CM | POA: Diagnosis not present

## 2022-10-22 ENCOUNTER — Ambulatory Visit
Admission: RE | Admit: 2022-10-22 | Discharge: 2022-10-22 | Disposition: A | Discharge status: Home / Self Care | Payer: Medicare Other | Source: Ambulatory Visit | Attending: Nurse Practitioner | Admitting: Nurse Practitioner

## 2022-10-22 DIAGNOSIS — R748 Abnormal levels of other serum enzymes: Secondary | ICD-10-CM | POA: Diagnosis not present

## 2022-10-22 DIAGNOSIS — K769 Liver disease, unspecified: Secondary | ICD-10-CM | POA: Diagnosis not present

## 2022-10-22 DIAGNOSIS — R160 Hepatomegaly, not elsewhere classified: Secondary | ICD-10-CM | POA: Diagnosis not present

## 2022-10-22 MED ORDER — GADOBUTROL 1 MMOL/ML IV SOLN
6.0000 mL | Freq: Once | INTRAVENOUS | Status: AC | PRN
  Administered 2022-10-22: 6 mL via INTRAVENOUS

## 2022-10-23 ENCOUNTER — Telehealth: Payer: Self-pay

## 2022-10-23 NOTE — Progress Notes (Signed)
No significant changes except having osteopenia of her spine.  Slightly worse osteopenia of her right femur and slightly improved degree of osteoporosis of her left forearm.

## 2022-10-23 NOTE — Progress Notes (Signed)
Please call the patient and let her know that the area of concern on the ultrasound was identified on her abdominal MRI as a hemangioma which is benign. No further follow up is recommended.

## 2022-10-27 ENCOUNTER — Telehealth: Payer: Self-pay

## 2022-10-27 NOTE — Telephone Encounter (Signed)
Left message for patient to give office a call.

## 2022-10-27 NOTE — Telephone Encounter (Signed)
Patient notified

## 2022-11-03 ENCOUNTER — Encounter: Payer: Self-pay | Admitting: Nurse Practitioner

## 2022-11-03 ENCOUNTER — Ambulatory Visit (INDEPENDENT_AMBULATORY_CARE_PROVIDER_SITE_OTHER): Payer: Medicare Other | Admitting: Nurse Practitioner

## 2022-11-03 VITALS — BP 120/80 | HR 69 | Temp 97.3°F | Resp 16 | Ht 70.0 in | Wt 134.8 lb

## 2022-11-03 DIAGNOSIS — D1803 Hemangioma of intra-abdominal structures: Secondary | ICD-10-CM | POA: Diagnosis not present

## 2022-11-03 DIAGNOSIS — R748 Abnormal levels of other serum enzymes: Secondary | ICD-10-CM | POA: Diagnosis not present

## 2022-11-03 DIAGNOSIS — E782 Mixed hyperlipidemia: Secondary | ICD-10-CM

## 2022-11-03 NOTE — Progress Notes (Signed)
[facility] [place], [place] [address]  Internal MEDICINE  Office Visit Note  Patient Name: Stephanie Dillon  [id number]  [id number]  Date of Service: 11/03/2022  Chief Complaint  Patient presents with   Follow-up    Review MRI results.     HPI Stephanie Dillon presents for a follow-up visit to review MRI results.   IMPRESSION: Fluid signal subcapsular lesion of the peripheral right lobe of the liver measuring 3.9 x 2.5 cm corresponding to ultrasound findings, consistent with a benign hepatic hemangioma. No further follow-up or characterization is required for this benign lesion.  Discussed hemangioma. No fatty liver per MRI findings.  Cholesterol levels are elevated but improving Wants to repeat lipid panel and hepatic function panel in 6 months, this is resonable.     Current Medication: Outpatient Encounter Medications as of 11/03/2022  Medication Sig   Biotin 1 MG CAPS Take 1 tablet by mouth daily.   calcium citrate-vitamin D (CITRACAL+D) 315-200 MG-UNIT per tablet Take 1 tablet by mouth daily.   ciclopirox (PENLAC) 8 % solution Apply topically at bedtime. Apply over nail and surrounding skin. Apply daily over previous coat. After seven (7) days, may remove with alcohol and continue cycle.   Cyanocobalamin (B-12) 250 MCG TABS Take 250 mcg by mouth daily.   folic acid (FOLVITE) A999333 MCG tablet Take 1 tablet by mouth daily.   levothyroxine (SYNTHROID, LEVOTHROID) 50 MCG tablet Take 50 mcg by mouth daily before breakfast.   Multiple Vitamin (MULTIVITAMIN) tablet Take 1 tablet by mouth daily.   vitamin C (ASCORBIC ACID) 500 MG tablet Take 500 mg by mouth daily.   No facility-administered encounter medications on file as of 11/03/2022.    Surgical History: Past Surgical History:  Procedure Laterality Date   TUBAL LIGATION      Medical History: Past Medical History:  Diagnosis Date   Hypothyroid    Thyroid disease     Family History: Family History   Problem Relation Age of Onset   Colon cancer Mother    AAA (abdominal aortic aneurysm) Mother    Breast cancer Neg Hx     Social History   Socioeconomic History   Marital status: Married    Spouse name: Not on file   Number of children: Not on file   Years of education: Not on file   Highest education level: Not on file  Occupational History   Not on file  Tobacco Use   Smoking status: Never   Smokeless tobacco: Never   Tobacco comments:    only tried in high school   Vaping Use   Vaping Use: Never used  Substance and Sexual Activity   Alcohol use: No   Drug use: No   Sexual activity: Not on file  Other Topics Concern   Not on file  Social History Narrative   Not on file   Social Determinants of Health   Financial Resource Strain: Not on file  Food Insecurity: Not on file  Transportation Needs: Not on file  Physical Activity: Not on file  Stress: Not on file  Social Connections: Not on file  Intimate Partner Violence: Not on file      Review of Systems  Constitutional:  Negative for chills, fatigue and unexpected weight change.  HENT:  Negative for congestion, rhinorrhea, sneezing and sore throat.   Eyes:  Negative for redness.  Respiratory:  Negative for cough, chest tightness and shortness of breath.   Cardiovascular:  Negative for chest pain  and palpitations.  Gastrointestinal:  Negative for abdominal pain, constipation, diarrhea, nausea and vomiting.  Genitourinary:  Negative for dysuria and frequency.  Musculoskeletal:  Negative for arthralgias, back pain, joint swelling and neck pain.  Skin:  Negative for rash.  Neurological: Negative.  Negative for tremors and numbness.  Hematological:  Negative for adenopathy. Does not bruise/bleed easily.  Psychiatric/Behavioral:  Negative for behavioral problems (Depression), sleep disturbance and suicidal ideas. The patient is not nervous/anxious.     Vital Signs: BP 120/80 Comment: 148/78  Pulse 69   Temp  (!) 97.3 F (36.3 C)   Resp 16   Ht 5\' 10"  (1.778 m)   Wt 134 lb 12.8 oz (61.1 kg)   SpO2 99%   BMI 19.34 kg/m    Physical Exam Vitals reviewed.  Constitutional:      General: She is not in acute distress.    Appearance: Normal appearance. She is normal weight. She is not ill-appearing.  HENT:     Head: Normocephalic and atraumatic.  Eyes:     Pupils: Pupils are equal, round, and reactive to light.  Cardiovascular:     Rate and Rhythm: Normal rate and regular rhythm.  Pulmonary:     Effort: Pulmonary effort is normal. No respiratory distress.  Neurological:     Mental Status: She is alert and oriented to person, place, and time.  Psychiatric:        Mood and Affect: Mood normal.        Behavior: Behavior normal.        Assessment/Plan: 1. Hemangioma of liver Repeat labs in 6 months  2. Mixed hyperlipidemia Repeat lipid panel in 6 months  3. Elevated liver enzymes Repeat hepatic function panel in 6 months    General Counseling: Piccola verbalizes understanding of the findings of todays visit and agrees with plan of treatment. I have discussed any further diagnostic evaluation that may be needed or ordered today. We also reviewed her medications today. she has been encouraged to call the office with any questions or concerns that should arise related to todays visit.    No orders of the defined types were placed in this encounter.   No orders of the defined types were placed in this encounter.   Return for F/U, previously scheduled, CPE, Lili Dillon PCP.   Total time spent:30 Minutes Time spent includes review of chart, medications, test results, and follow up plan with the patient.   [place] Controlled Substance Database was reviewed by me.  This patient was seen by Jonetta Osgood, FNP-C in collaboration with Dr. Clayborn Bigness as a part of collaborative care agreement.   Aleksandra Raben R. Valetta Fuller, MSN, FNP-C Internal medicine

## 2022-12-08 ENCOUNTER — Ambulatory Visit (INDEPENDENT_AMBULATORY_CARE_PROVIDER_SITE_OTHER): Payer: Medicare Other | Admitting: Dermatology

## 2022-12-08 ENCOUNTER — Encounter: Payer: Self-pay | Admitting: Dermatology

## 2022-12-08 VITALS — BP 108/67 | HR 63

## 2022-12-08 DIAGNOSIS — L570 Actinic keratosis: Secondary | ICD-10-CM | POA: Diagnosis not present

## 2022-12-08 DIAGNOSIS — L578 Other skin changes due to chronic exposure to nonionizing radiation: Secondary | ICD-10-CM

## 2022-12-08 DIAGNOSIS — W908XXA Exposure to other nonionizing radiation, initial encounter: Secondary | ICD-10-CM | POA: Diagnosis not present

## 2022-12-08 DIAGNOSIS — D2261 Melanocytic nevi of right upper limb, including shoulder: Secondary | ICD-10-CM | POA: Diagnosis not present

## 2022-12-08 DIAGNOSIS — L821 Other seborrheic keratosis: Secondary | ICD-10-CM | POA: Diagnosis not present

## 2022-12-08 DIAGNOSIS — Z1283 Encounter for screening for malignant neoplasm of skin: Secondary | ICD-10-CM

## 2022-12-08 DIAGNOSIS — L820 Inflamed seborrheic keratosis: Secondary | ICD-10-CM

## 2022-12-08 DIAGNOSIS — D2262 Melanocytic nevi of left upper limb, including shoulder: Secondary | ICD-10-CM | POA: Diagnosis not present

## 2022-12-08 DIAGNOSIS — L918 Other hypertrophic disorders of the skin: Secondary | ICD-10-CM | POA: Diagnosis not present

## 2022-12-08 DIAGNOSIS — X32XXXA Exposure to sunlight, initial encounter: Secondary | ICD-10-CM

## 2022-12-08 DIAGNOSIS — L814 Other melanin hyperpigmentation: Secondary | ICD-10-CM

## 2022-12-08 DIAGNOSIS — D229 Melanocytic nevi, unspecified: Secondary | ICD-10-CM

## 2022-12-08 NOTE — Progress Notes (Signed)
Follow-Up Visit   Subjective  Stephanie Dillon is a 72 y.o. female who presents for the following: Skin Cancer Screening and Full Body Skin Exam.  She has spot on L shoulder area that is new and itchy.  The patient presents for Total-Body Skin Exam (TBSE) for skin cancer screening and mole check. The patient has spots, moles and lesions to be evaluated, some may be new or changing and the patient has concerns that these could be cancer.    The following portions of the chart were reviewed this encounter and updated as appropriate: medications, allergies, medical history  Review of Systems:  No other skin or systemic complaints except as noted in HPI or Assessment and Plan.  Objective  Well appearing patient in no apparent distress; mood and affect are within normal limits.  A full examination was performed including scalp, head, eyes, ears, nose, lips, neck, chest, axillae, abdomen, back, buttocks, bilateral upper extremities, bilateral lower extremities, hands, feet, fingers, toes, fingernails, and toenails. All findings within normal limits unless otherwise noted below.   Relevant physical exam findings are noted in the Assessment and Plan.  L med shoulder x 1 Erythematous stuck-on, waxy papule  L zygoma x 3, L lat eyebrow x 1 (4) Pink scaly macules.  right lower calf 4 mm tan violaceous macule on the right lower calf       Assessment & Plan   LENTIGINES, SEBORRHEIC KERATOSES, HEMANGIOMAS - Benign normal skin lesions - Benign-appearing - Call for any changes  MELANOCYTIC NEVI - Tan-brown and/or pink-flesh-colored symmetric macules and papules  right upper arm: 3 mm speckled brown papule- stable compared to photo 09/17/2020   Left Upper Arm: 1 mm brown papule adjacent 3mm light brown macule on left upper arm- stable compared to photo 09/17/2020  - Benign appearing on exam today - Observation - Call clinic for new or changing moles - Recommend daily use of broad  spectrum spf 30+ sunscreen to sun-exposed areas.   ACTINIC DAMAGE - Chronic condition, secondary to cumulative UV/sun exposure - diffuse scaly erythematous macules with underlying dyspigmentation - Recommend daily broad spectrum sunscreen SPF 30+ to sun-exposed areas, reapply every 2 hours as needed.  - Staying in the shade or wearing long sleeves, sun glasses (UVA+UVB protection) and wide brim hats (4-inch brim around the entire circumference of the hat) are also recommended for sun protection.  - Call for new or changing lesions.  SKIN CANCER SCREENING PERFORMED TODAY.  Acrochordons (Skin Tags) - Fleshy, skin-colored pedunculated papules - Benign appearing.  - Observe. - If desired, they can be removed with an in office procedure that is not covered by insurance. - Please call the clinic if you notice any new or changing lesions.  Inflamed seborrheic keratosis L med shoulder x 1  Symptomatic, irritating, patient would like treated.  Destruction of lesion - L med shoulder x 1  Destruction method: cryotherapy   Informed consent: discussed and consent obtained   Lesion destroyed using liquid nitrogen: Yes   Region frozen until ice ball extended beyond lesion: Yes   Outcome: patient tolerated procedure well with no complications   Post-procedure details: wound care instructions given   Additional details:  Prior to procedure, discussed risks of blister formation, small wound, skin dyspigmentation, or rare scar following cryotherapy. Recommend Vaseline ointment to treated areas while healing.   AK (actinic keratosis) (4) L zygoma x 3, L lat eyebrow x 1  Actinic keratoses are precancerous spots that appear secondary to cumulative UV  radiation exposure/sun exposure over time. They are chronic with expected duration over 1 year. A portion of actinic keratoses will progress to squamous cell carcinoma of the skin. It is not possible to reliably predict which spots will progress to skin  cancer and so treatment is recommended to prevent development of skin cancer.  Recommend daily broad spectrum sunscreen SPF 30+ to sun-exposed areas, reapply every 2 hours as needed.  Recommend staying in the shade or wearing long sleeves, sun glasses (UVA+UVB protection) and wide brim hats (4-inch brim around the entire circumference of the hat). Call for new or changing lesions.  Destruction of lesion - L zygoma x 3, L lat eyebrow x 1  Destruction method: cryotherapy   Informed consent: discussed and consent obtained   Lesion destroyed using liquid nitrogen: Yes   Region frozen until ice ball extended beyond lesion: Yes   Outcome: patient tolerated procedure well with no complications   Post-procedure details: wound care instructions given   Additional details:  Prior to procedure, discussed risks of blister formation, small wound, skin dyspigmentation, or rare scar following cryotherapy. Recommend Vaseline ointment to treated areas while healing.   Lentigo right lower calf  vs SEBORRHEIC KERATOSIS (W/ PURPURA)  Benign-appearing.  Photo today. Observation.  Call clinic for new or changing lesions.  Recommend daily use of broad spectrum spf 30+ sunscreen to sun-exposed areas.     Return in about 1 year (around 12/08/2023) for Hx AKs.  ICherlyn Labella, CMA, am acting as scribe for Willeen Niece, MD .   Documentation: I have reviewed the above documentation for accuracy and completeness, and I agree with the above.  Willeen Niece, MD

## 2022-12-08 NOTE — Patient Instructions (Addendum)
Cryotherapy Aftercare  Wash gently with soap and water everyday.   Apply Vaseline and Band-Aid daily until healed.     Due to recent changes in healthcare laws, you may see results of your pathology and/or laboratory studies on MyChart before the doctors have had a chance to review them. We understand that in some cases there may be results that are confusing or concerning to you. Please understand that not all results are received at the same time and often the doctors may need to interpret multiple results in order to provide you with the best plan of care or course of treatment. Therefore, we ask that you please give us 2 business days to thoroughly review all your results before contacting the office for clarification. Should we see a critical lab result, you will be contacted sooner.   If You Need Anything After Your Visit  If you have any questions or concerns for your doctor, please call our main line at [phone] and press option 4 to reach your doctor's medical assistant. If no one answers, please leave a voicemail as directed and we will return your call as soon as possible. Messages left after 4 pm will be answered the following business day.   You may also send us a message via MyChart. We typically respond to MyChart messages within 1-2 business days.  For prescription refills, please ask your pharmacy to contact our office. Our fax number is [phone].  If you have an urgent issue when the clinic is closed that cannot wait until the next business day, you can page your doctor at the number below.    Please note that while we do our best to be available for urgent issues outside of office hours, we are not available 24/7.   If you have an urgent issue and are unable to reach us, you may choose to seek medical care at your doctor's office, retail clinic, urgent care center, or emergency room.  If you have a medical emergency, please immediately call 911 or go to the  emergency department.  Pager Numbers  - Dr. Kowalski: [phone]  - Dr. Moye: [phone]  - Dr. Stewart: [phone]  In the event of inclement weather, please call our main line at [phone] for an update on the status of any delays or closures.  Dermatology Medication Tips: Please keep the boxes that topical medications come in in order to help keep track of the instructions about where and how to use these. Pharmacies typically print the medication instructions only on the boxes and not directly on the medication tubes.   If your medication is too expensive, please contact our office at [phone] option 4 or send us a message through MyChart.   We are unable to tell what your co-pay for medications will be in advance as this is different depending on your insurance coverage. However, we may be able to find a substitute medication at lower cost or fill out paperwork to get insurance to cover a needed medication.   If a prior authorization is required to get your medication covered by your insurance company, please allow us 1-2 business days to complete this process.  Drug prices often vary depending on where the prescription is filled and some pharmacies may offer cheaper prices.  The website www.goodrx.com contains coupons for medications through different pharmacies. The prices here do not account for what the cost may be with help from insurance (it may be cheaper with your insurance), but the website can   give you the price if you did not use any insurance.  - You can print the associated coupon and take it with your prescription to the pharmacy.  - You may also stop by our office during regular business hours and pick up a GoodRx coupon card.  - If you need your prescription sent electronically to a different pharmacy, notify our office through [facility] MyChart or by phone at [phone] option 4.     Si Usted Necesita Algo Despus de Su Visita  Tambin puede  enviarnos un mensaje a travs de MyChart. Por lo general respondemos a los mensajes de MyChart en el transcurso de 1 a 2 das hbiles.  Para renovar recetas, por favor pida a su farmacia que se ponga en contacto con nuestra oficina. Nuestro nmero de fax es el [phone].  Si tiene un asunto urgente cuando la clnica est cerrada y que no puede esperar hasta el siguiente da hbil, puede llamar/localizar a su doctor(a) al nmero que aparece a continuacin.   Por favor, tenga en cuenta que aunque hacemos todo lo posible para estar disponibles para asuntos urgentes fuera del horario de oficina, no estamos disponibles las 24 horas del da, los 7 das de la semana.   Si tiene un problema urgente y no puede comunicarse con nosotros, puede optar por buscar atencin mdica  en el consultorio de su doctor(a), en una clnica privada, en un centro de atencin urgente o en una sala de emergencias.  Si tiene una emergencia mdica, por favor llame inmediatamente al 911 o vaya a la sala de emergencias.  Nmeros de bper  - Dr. Kowalski: [phone]  - Dra. Moye: [phone]  - Dra. Stewart: [phone]  En caso de inclemencias del tiempo, por favor llame a nuestra lnea principal al [phone] para una actualizacin sobre el estado de cualquier retraso o cierre.  Consejos para la medicacin en dermatologa: Por favor, guarde las cajas en las que vienen los medicamentos de uso tpico para ayudarle a seguir las instrucciones sobre dnde y cmo usarlos. Las farmacias generalmente imprimen las instrucciones del medicamento slo en las cajas y no directamente en los tubos del medicamento.   Si su medicamento es muy caro, por favor, pngase en contacto con nuestra oficina llamando al [phone] y presione la opcin 4 o envenos un mensaje a travs de MyChart.   No podemos decirle cul ser su copago por los medicamentos por adelantado ya que esto es diferente dependiendo de la cobertura de su seguro.  Sin embargo, es posible que podamos encontrar un medicamento sustituto a menor costo o llenar un formulario para que el seguro cubra el medicamento que se considera necesario.   Si se requiere una autorizacin previa para que su compaa de seguros cubra su medicamento, por favor permtanos de 1 a 2 das hbiles para completar este proceso.  Los precios de los medicamentos varan con frecuencia dependiendo del lugar de dnde se surte la receta y alguna farmacias pueden ofrecer precios ms baratos.  El sitio web www.goodrx.com tiene cupones para medicamentos de diferentes farmacias. Los precios aqu no tienen en cuenta lo que podra costar con la ayuda del seguro (puede ser ms barato con su seguro), pero el sitio web puede darle el precio si no utiliz ningn seguro.  - Puede imprimir el cupn correspondiente y llevarlo con su receta a la farmacia.  - Tambin puede pasar por nuestra oficina durante el horario de atencin regular y recoger una tarjeta de cupones de GoodRx.  -   Si necesita que su receta se enve electrnicamente a una farmacia diferente, informe a nuestra oficina a travs de MyChart de [facility] o por telfono llamando al [phone] y presione la opcin 4.  

## 2022-12-16 DIAGNOSIS — H43811 Vitreous degeneration, right eye: Secondary | ICD-10-CM | POA: Diagnosis not present

## 2023-05-25 DIAGNOSIS — E063 Autoimmune thyroiditis: Secondary | ICD-10-CM | POA: Diagnosis not present

## 2023-05-25 DIAGNOSIS — Z1330 Encounter for screening examination for mental health and behavioral disorders, unspecified: Secondary | ICD-10-CM | POA: Diagnosis not present

## 2023-05-27 ENCOUNTER — Ambulatory Visit (INDEPENDENT_AMBULATORY_CARE_PROVIDER_SITE_OTHER): Payer: Medicare Other

## 2023-05-27 DIAGNOSIS — Z23 Encounter for immunization: Secondary | ICD-10-CM | POA: Diagnosis not present

## 2023-08-16 ENCOUNTER — Telehealth: Payer: Self-pay | Admitting: Nurse Practitioner

## 2023-08-16 NOTE — Telephone Encounter (Signed)
 Appt request for 1/14 forward to [facility]

## 2023-08-17 ENCOUNTER — Ambulatory Visit: Payer: Medicare Other | Admitting: Nurse Practitioner

## 2023-08-24 ENCOUNTER — Encounter: Payer: Self-pay | Admitting: Nurse Practitioner

## 2023-08-24 ENCOUNTER — Ambulatory Visit (INDEPENDENT_AMBULATORY_CARE_PROVIDER_SITE_OTHER): Payer: Medicare Other | Admitting: Nurse Practitioner

## 2023-08-24 VITALS — BP 130/86 | HR 64 | Temp 98.2°F | Resp 16 | Ht 70.0 in | Wt 137.0 lb

## 2023-08-24 DIAGNOSIS — E063 Autoimmune thyroiditis: Secondary | ICD-10-CM | POA: Insufficient documentation

## 2023-08-24 DIAGNOSIS — Z1231 Encounter for screening mammogram for malignant neoplasm of breast: Secondary | ICD-10-CM

## 2023-08-24 DIAGNOSIS — Z0000 Encounter for general adult medical examination without abnormal findings: Secondary | ICD-10-CM | POA: Diagnosis not present

## 2023-08-24 DIAGNOSIS — D1803 Hemangioma of intra-abdominal structures: Secondary | ICD-10-CM | POA: Insufficient documentation

## 2023-08-24 DIAGNOSIS — K760 Fatty (change of) liver, not elsewhere classified: Secondary | ICD-10-CM | POA: Diagnosis not present

## 2023-08-24 NOTE — Progress Notes (Signed)
[facility] [address] [place], [place] [address]  Internal MEDICINE  Office Visit Note  Patient Name: Stephanie Dillon  [id number]  [id number]  Date of Service: 08/24/2023  Chief Complaint  Patient presents with   Medicare Wellness    HPI Yaritzel presents for an annual well visit and physical exam.  Well-appearing 73 y.o. female with osteoporosis and bladder prolapse, NAFLD, hemangioma of liver, and hashimoto's disease.  Routine CRC screening: due in 2026 for colonoscopy  Routine mammogram: due in February  DEXA scan:done last year  Labs: due for routine labs.  New or worsening pain: none  Other concerns: has cataracts in both eyes but they are not large enough to remove.      08/24/2023   11:01 AM 08/10/2022    9:51 AM 08/07/2021   11:14 AM  MMSE - Mini Mental State Exam  Orientation to time 5 5 5   Orientation to Place 5 5 5   Registration 3 3 3   Attention/ Calculation 5 5 5   Recall 3 3 3   Language- name 2 objects 2 2 2   Language- repeat 1 1 1   Language- follow 3 step command 3 3 3   Language- read & follow direction 1 1 1   Write a sentence 1 1 1   Copy design 1 1 1   Total score 30 30 30     Functional Status Survey: Is the patient deaf or have difficulty hearing?: No Does the patient have difficulty seeing, even when wearing glasses/contacts?: No Does the patient have difficulty concentrating, remembering, or making decisions?: No Does the patient have difficulty walking or climbing stairs?: No Does the patient have difficulty dressing or bathing?: No Does the patient have difficulty doing errands alone such as visiting a doctor's office or shopping?: No     08/07/2021   11:08 AM 08/07/2021   11:09 AM 08/10/2022    9:49 AM 11/03/2022    9:42 AM 08/24/2023   11:00 AM  Fall Risk  Falls in the past year? 1 1 0 0 0  Was there an injury with Fall? 0 0 0 0 0  Fall Risk Category Calculator 1 1 0 0 0  Fall Risk Category (Retired) Low Low Low    (RETIRED) Patient  Fall Risk Level Low fall risk Low fall risk Low fall risk    Patient at Risk for Falls Due to   No Fall Risks No Fall Risks No Fall Risks  Fall risk Follow up Falls evaluation completed Falls evaluation completed Falls evaluation completed Falls evaluation completed Falls evaluation completed       08/24/2023   11:00 AM  Depression screen PHQ 2/9  Decreased Interest 0  Down, Depressed, Hopeless 0  PHQ - 2 Score 0       Current Medication: Outpatient Encounter Medications as of 08/24/2023  Medication Sig   Biotin 1 MG CAPS Take 1 tablet by mouth daily.   calcium citrate-vitamin D (CITRACAL+D) 315-200 MG-UNIT per tablet Take 1 tablet by mouth daily.   ciclopirox (PENLAC) 8 % solution Apply topically at bedtime. Apply over nail and surrounding skin. Apply daily over previous coat. After seven (7) days, may remove with alcohol and continue cycle.   Cyanocobalamin (B-12) 250 MCG TABS Take 250 mcg by mouth daily.   folic acid (FOLVITE) 400 MCG tablet Take 1 tablet by mouth daily.   levothyroxine (SYNTHROID, LEVOTHROID) 50 MCG tablet Take 50 mcg by mouth daily before breakfast.   Multiple Vitamin (MULTIVITAMIN) tablet Take 1 tablet by mouth  daily.   vitamin C (ASCORBIC ACID) 500 MG tablet Take 500 mg by mouth daily.   No facility-administered encounter medications on file as of 08/24/2023.    Surgical History: Past Surgical History:  Procedure Laterality Date   TUBAL LIGATION      Medical History: Past Medical History:  Diagnosis Date   Actinic keratosis    Hypothyroid    Thyroid disease     Family History: Family History  Problem Relation Age of Onset   Colon cancer Mother    AAA (abdominal aortic aneurysm) Mother    Breast cancer Neg Hx     Social History   Socioeconomic History   Marital status: Married    Spouse name: Not on file   Number of children: Not on file   Years of education: Not on file   Highest education level: Not on file  Occupational History    Not on file  Tobacco Use   Smoking status: Never   Smokeless tobacco: Never   Tobacco comments:    only tried in high school   Vaping Use   Vaping status: Never Used  Substance and Sexual Activity   Alcohol use: No   Drug use: No   Sexual activity: Not on file  Other Topics Concern   Not on file  Social History Narrative   Not on file   Social Drivers of Health   Financial Resource Strain: Not on file  Food Insecurity: No Food Insecurity (05/19/2022)   Received from [facility], [facility]   Hunger Vital Sign    Worried About Running Out of Food in the Last Year: Never true    Ran Out of Food in the Last Year: Never true  Transportation Needs: Not on file  Physical Activity: Not on file  Stress: Not on file  Social Connections: Unknown (12/14/2021)   Received from [facility], [facility]   Social Network    Social Network: Not on file  Intimate Partner Violence: Not At Risk (10/20/2022)   Received from [facility], [facility]   Humiliation, Afraid, Rape, and Kick questionnaire    Fear of Current or Ex-Partner: No    Emotionally Abused: No    Physically Abused: No    Sexually Abused: No      Review of Systems  Constitutional:  Negative for activity change, appetite change, chills, fatigue, fever and unexpected weight change.  HENT: Negative.  Negative for congestion, ear pain, rhinorrhea, sore throat and trouble swallowing.   Eyes: Negative.   Respiratory: Negative.  Negative for cough, chest tightness, shortness of breath and wheezing.   Cardiovascular: Negative.  Negative for chest pain.  Gastrointestinal: Negative.  Negative for abdominal pain, blood in stool, constipation, diarrhea, nausea and vomiting.  Endocrine: Negative.   Genitourinary: Negative.  Negative for difficulty urinating, dysuria, frequency, hematuria and urgency.  Musculoskeletal: Negative.  Negative for arthralgias, back pain, joint swelling, myalgias and neck pain.  Skin:  Negative.  Negative for rash and wound.  Allergic/Immunologic: Negative.  Negative for immunocompromised state.  Neurological: Negative.  Negative for dizziness, seizures, numbness and headaches.  Hematological: Negative.   Psychiatric/Behavioral: Negative.  Negative for behavioral problems, self-injury and suicidal ideas. The patient is not nervous/anxious.     Vital Signs: BP 130/86   Pulse 64   Temp 98.2 F (36.8 C)   Resp 16   Ht 5\' 10"  (1.778 m)   Wt 137 lb (62.1 kg)   SpO2 99%   BMI  19.66 kg/m    Physical Exam Vitals reviewed.  Constitutional:      General: She is awake. She is not in acute distress.    Appearance: Normal appearance. She is well-developed and well-groomed. She is obese. She is not ill-appearing or diaphoretic.  HENT:     Head: Normocephalic and atraumatic.     Right Ear: Tympanic membrane, ear canal and external ear normal.     Left Ear: Tympanic membrane, ear canal and external ear normal.     Nose: Nose normal. No congestion or rhinorrhea.     Mouth/Throat:     Lips: Pink.     Mouth: Mucous membranes are moist.     Pharynx: Oropharynx is clear. Uvula midline. No oropharyngeal exudate or posterior oropharyngeal erythema.  Eyes:     General: Lids are normal. Vision grossly intact. Gaze aligned appropriately. No scleral icterus.       Right eye: No discharge.        Left eye: No discharge.     Extraocular Movements: Extraocular movements intact.     Conjunctiva/sclera: Conjunctivae normal.     Pupils: Pupils are equal, round, and reactive to light.     Funduscopic exam:    Right eye: Red reflex present.        Left eye: Red reflex present. Neck:     Thyroid: No thyromegaly.     Vascular: No JVD.     Trachea: Trachea and phonation normal. No tracheal deviation.  Cardiovascular:     Rate and Rhythm: Normal rate and regular rhythm.     Pulses: Normal pulses.     Heart sounds: Normal heart sounds, S1 normal and S2 normal. No murmur heard.    No  friction rub. No gallop.  Pulmonary:     Effort: Pulmonary effort is normal. No accessory muscle usage or respiratory distress.     Breath sounds: Normal breath sounds and air entry. No stridor. No wheezing or rales.  Chest:     Chest wall: No tenderness.     Comments: Declined clinical breast exam, patient will get mammogram done.  Abdominal:     General: Bowel sounds are normal. There is no distension.     Palpations: Abdomen is soft. There is no shifting dullness, fluid wave, mass or pulsatile mass.     Tenderness: There is no abdominal tenderness. There is no guarding or rebound.  Musculoskeletal:        General: No tenderness or deformity. Normal range of motion.     Cervical back: Normal range of motion and neck supple.     Right lower leg: No edema.     Left lower leg: No edema.  Lymphadenopathy:     Cervical: No cervical adenopathy.  Skin:    General: Skin is warm and dry.     Capillary Refill: Capillary refill takes less than 2 seconds.     Coloration: Skin is not pale.     Findings: No erythema or rash.  Neurological:     Mental Status: She is alert and oriented to person, place, and time.     Cranial Nerves: No cranial nerve deficit.     Motor: No abnormal muscle tone.     Coordination: Coordination normal.     Gait: Gait normal.     Deep Tendon Reflexes: Reflexes are normal and symmetric.  Psychiatric:        Mood and Affect: Mood normal.        Behavior: Behavior normal. Behavior  is cooperative.        Thought Content: Thought content normal.        Judgment: Judgment normal.        Assessment/Plan: 1. Encounter for subsequent annual wellness visit (AWV) in Medicare patient Age-appropriate preventive screenings and vaccinations discussed, annual physical exam completed. Routine labs for health maintenance ordered (if needed). PHM updated.   2. NAFLD (nonalcoholic fatty liver disease) Fatty liver, patient aware, will continue to monitor periodically. Labs in  October showed improvement of liver enzymes back to normal range.   3. Hemangioma of liver Found on abdominal MRI, no further follow up needed. Patient is aware   4. Hashimoto's disease Sees endocrinology  5. Encounter for screening mammogram for malignant neoplasm of breast (Primary) Routine mammogram ordered  - MM 3D SCREENING MAMMOGRAM BILATERAL BREAST; Future    General Counseling: Tosca verbalizes understanding of the findings of todays visit and agrees with plan of treatment. I have discussed any further diagnostic evaluation that may be needed or ordered today. We also reviewed her medications today. she has been encouraged to call the office with any questions or concerns that should arise related to todays visit.    Orders Placed This Encounter  Procedures   MM 3D SCREENING MAMMOGRAM BILATERAL BREAST    No orders of the defined types were placed in this encounter.   Return in about 1 year (around 08/23/2024) for AWV, Koriana Stepien PCP and otherwise as needed. .   Total time spent:30 Minutes Time spent includes review of chart, medications, test results, and follow up plan with the patient.   [place] Controlled Substance Database was reviewed by me.  This patient was seen by Sallyanne Kuster, FNP-C in collaboration with Dr. Beverely Risen as a part of collaborative care agreement.  Sonda Coppens R. Tedd Sias, MSN, FNP-C Internal medicine

## 2023-09-14 ENCOUNTER — Other Ambulatory Visit
Admission: RE | Admit: 2023-09-14 | Discharge: 2023-09-14 | Disposition: A | Discharge status: Home / Self Care | Payer: Medicare Other | Source: Home / Self Care | Attending: Nurse Practitioner | Admitting: Nurse Practitioner

## 2023-09-14 ENCOUNTER — Ambulatory Visit
Admission: RE | Admit: 2023-09-14 | Discharge: 2023-09-14 | Disposition: A | Discharge status: Home / Self Care | Payer: Medicare Other | Source: Ambulatory Visit | Attending: Nurse Practitioner | Admitting: Nurse Practitioner

## 2023-09-14 DIAGNOSIS — E559 Vitamin D deficiency, unspecified: Secondary | ICD-10-CM | POA: Diagnosis not present

## 2023-09-14 DIAGNOSIS — E782 Mixed hyperlipidemia: Secondary | ICD-10-CM | POA: Insufficient documentation

## 2023-09-14 DIAGNOSIS — Z1231 Encounter for screening mammogram for malignant neoplasm of breast: Secondary | ICD-10-CM | POA: Insufficient documentation

## 2023-09-14 LAB — CBC WITH DIFFERENTIAL/PLATELET
Abs Immature Granulocytes: 0.01 10*3/uL (ref 0.00–0.07)
Basophils Absolute: 0.1 10*3/uL (ref 0.0–0.1)
Basophils Relative: 2 %
Eosinophils Absolute: 0.2 10*3/uL (ref 0.0–0.5)
Eosinophils Relative: 5 %
HCT: 41.6 % (ref 36.0–46.0)
Hemoglobin: 14.2 g/dL (ref 12.0–15.0)
Immature Granulocytes: 0 %
Lymphocytes Relative: 30 %
Lymphs Abs: 1.2 10*3/uL (ref 0.7–4.0)
MCH: 31.1 pg (ref 26.0–34.0)
MCHC: 34.1 g/dL (ref 30.0–36.0)
MCV: 91.2 fL (ref 80.0–100.0)
Monocytes Absolute: 0.3 10*3/uL (ref 0.1–1.0)
Monocytes Relative: 8 %
Neutro Abs: 2.2 10*3/uL (ref 1.7–7.7)
Neutrophils Relative %: 55 %
Platelets: 233 10*3/uL (ref 150–400)
RBC: 4.56 MIL/uL (ref 3.87–5.11)
RDW: 12.3 % (ref 11.5–15.5)
WBC: 4 10*3/uL (ref 4.0–10.5)
nRBC: 0 % (ref 0.0–0.2)

## 2023-09-14 LAB — COMPREHENSIVE METABOLIC PANEL
ALT: 17 U/L (ref 0–44)
AST: 22 U/L (ref 15–41)
Albumin: 4.1 g/dL (ref 3.5–5.0)
Alkaline Phosphatase: 54 U/L (ref 38–126)
Anion gap: 11 (ref 5–15)
BUN: 14 mg/dL (ref 8–23)
CO2: 24 mmol/L (ref 22–32)
Calcium: 9 mg/dL (ref 8.9–10.3)
Chloride: 95 mmol/L — ABNORMAL LOW (ref 98–111)
Creatinine, Ser: 0.54 mg/dL (ref 0.44–1.00)
GFR, Estimated: >60 mL/min (ref >60)
Glucose, Bld: 87 mg/dL (ref 70–99)
Potassium: 4.4 mmol/L (ref 3.5–5.1)
Sodium: 130 mmol/L — ABNORMAL LOW (ref 135–145)
Total Bilirubin: 1 mg/dL (ref 0.0–1.2)
Total Protein: 7.6 g/dL (ref 6.5–8.1)

## 2023-09-14 LAB — VITAMIN D 25 HYDROXY (VIT D DEFICIENCY, FRACTURES): Vit D, 25-Hydroxy: 33.99 ng/mL (ref 30–100)

## 2023-09-14 LAB — LIPID PANEL
Cholesterol: 231 mg/dL — ABNORMAL HIGH (ref 0–200)
HDL: 97 mg/dL (ref >40)
LDL Cholesterol: 120 mg/dL — ABNORMAL HIGH (ref 0–99)
Total CHOL/HDL Ratio: 2.4 {ratio}
Triglycerides: 71 mg/dL (ref <150)
VLDL: 14 mg/dL (ref 0–40)

## 2023-09-14 LAB — VITAMIN B12: Vitamin B-12: 665 pg/mL (ref 180–914)

## 2023-09-14 LAB — FOLATE: Folate: 18.7 ng/mL (ref >5.9)

## 2023-10-19 ENCOUNTER — Other Ambulatory Visit: Payer: Self-pay | Admitting: Nurse Practitioner

## 2023-10-19 ENCOUNTER — Telehealth: Payer: Self-pay

## 2023-10-19 DIAGNOSIS — E871 Hypo-osmolality and hyponatremia: Secondary | ICD-10-CM

## 2023-10-19 NOTE — Telephone Encounter (Signed)
 As per alyssa advised pt that her sodium and chloride is low may be due she dehydrated we can repeat BMP not need to do fasting and chol is improving and all other labs normal and labs slips is ready for pickup

## 2023-10-26 ENCOUNTER — Other Ambulatory Visit
Admission: RE | Admit: 2023-10-26 | Discharge: 2023-10-26 | Disposition: A | Discharge status: Home / Self Care | Source: Ambulatory Visit | Attending: Nurse Practitioner | Admitting: Nurse Practitioner

## 2023-10-26 DIAGNOSIS — E871 Hypo-osmolality and hyponatremia: Secondary | ICD-10-CM | POA: Insufficient documentation

## 2023-10-26 LAB — BASIC METABOLIC PANEL
Anion gap: 8 (ref 5–15)
BUN: 16 mg/dL (ref 8–23)
CO2: 27 mmol/L (ref 22–32)
Calcium: 9.9 mg/dL (ref 8.9–10.3)
Chloride: 99 mmol/L (ref 98–111)
Creatinine, Ser: 0.61 mg/dL (ref 0.44–1.00)
GFR, Estimated: >60 mL/min (ref >60)
Glucose, Bld: 97 mg/dL (ref 70–99)
Potassium: 4.9 mmol/L (ref 3.5–5.1)
Sodium: 134 mmol/L — ABNORMAL LOW (ref 135–145)

## 2023-11-11 DIAGNOSIS — N8110 Cystocele, unspecified: Secondary | ICD-10-CM | POA: Diagnosis not present

## 2023-11-11 DIAGNOSIS — N3281 Overactive bladder: Secondary | ICD-10-CM | POA: Diagnosis not present

## 2023-11-11 DIAGNOSIS — Z4689 Encounter for fitting and adjustment of other specified devices: Secondary | ICD-10-CM | POA: Diagnosis not present

## 2023-11-11 DIAGNOSIS — N816 Rectocele: Secondary | ICD-10-CM | POA: Diagnosis not present

## 2023-12-14 ENCOUNTER — Encounter: Payer: Self-pay | Admitting: Dermatology

## 2023-12-14 ENCOUNTER — Ambulatory Visit: Payer: Medicare Other | Admitting: Dermatology

## 2023-12-14 DIAGNOSIS — L578 Other skin changes due to chronic exposure to nonionizing radiation: Secondary | ICD-10-CM

## 2023-12-14 DIAGNOSIS — Z1283 Encounter for screening for malignant neoplasm of skin: Secondary | ICD-10-CM | POA: Diagnosis not present

## 2023-12-14 DIAGNOSIS — L821 Other seborrheic keratosis: Secondary | ICD-10-CM | POA: Diagnosis not present

## 2023-12-14 DIAGNOSIS — L814 Other melanin hyperpigmentation: Secondary | ICD-10-CM | POA: Diagnosis not present

## 2023-12-14 DIAGNOSIS — L570 Actinic keratosis: Secondary | ICD-10-CM

## 2023-12-14 DIAGNOSIS — D2371 Other benign neoplasm of skin of right lower limb, including hip: Secondary | ICD-10-CM | POA: Diagnosis not present

## 2023-12-14 DIAGNOSIS — B351 Tinea unguium: Secondary | ICD-10-CM

## 2023-12-14 DIAGNOSIS — D229 Melanocytic nevi, unspecified: Secondary | ICD-10-CM | POA: Diagnosis not present

## 2023-12-14 DIAGNOSIS — W908XXA Exposure to other nonionizing radiation, initial encounter: Secondary | ICD-10-CM

## 2023-12-14 DIAGNOSIS — D2261 Melanocytic nevi of right upper limb, including shoulder: Secondary | ICD-10-CM

## 2023-12-14 DIAGNOSIS — D1801 Hemangioma of skin and subcutaneous tissue: Secondary | ICD-10-CM

## 2023-12-14 DIAGNOSIS — D2262 Melanocytic nevi of left upper limb, including shoulder: Secondary | ICD-10-CM

## 2023-12-14 DIAGNOSIS — D239 Other benign neoplasm of skin, unspecified: Secondary | ICD-10-CM

## 2023-12-14 DIAGNOSIS — Z872 Personal history of diseases of the skin and subcutaneous tissue: Secondary | ICD-10-CM

## 2023-12-14 MED ORDER — CICLOPIROX 8 % EX SOLN: Freq: Every day | CUTANEOUS | 11 refills | Status: AC

## 2023-12-14 NOTE — Progress Notes (Signed)
 Follow-Up Visit   Subjective  Stephanie Dillon is a 73 y.o. female who presents for the following: Skin Cancer Screening and Full Body Skin Exam. Hx of AKs. No personal Hx of skin cancer or dysplastic nevi.   Rough spot at right upper forehead.   The patient presents for Total-Body Skin Exam (TBSE) for skin cancer screening and mole check. The patient has spots, moles and lesions to be evaluated, some may be new or changing and the patient may have concern these could be cancer.    The following portions of the chart were reviewed this encounter and updated as appropriate: medications, allergies, medical history  Review of Systems:  No other skin or systemic complaints except as noted in HPI or Assessment and Plan.  Objective  Well appearing patient in no apparent distress; mood and affect are within normal limits.  A full examination was performed including scalp, head, eyes, ears, nose, lips, neck, chest, axillae, abdomen, back, buttocks, bilateral upper extremities, bilateral lower extremities, hands, feet, fingers, toes, fingernails, and toenails. All findings within normal limits unless otherwise noted below.   Relevant physical exam findings are noted in the Assessment and Plan.  Right Forehead x2 (2) Erythematous thin papules/macules with gritty scale.   Assessment & Plan   SKIN CANCER SCREENING PERFORMED TODAY.  ACTINIC DAMAGE - Chronic condition, secondary to cumulative UV/sun exposure - diffuse scaly erythematous macules with underlying dyspigmentation - Recommend daily broad spectrum sunscreen SPF 30+ to sun-exposed areas, reapply every 2 hours as needed.  - Staying in the shade or wearing long sleeves, sun glasses (UVA+UVB protection) and wide brim hats (4-inch brim around the entire circumference of the hat) are also recommended for sun protection.  - Call for new or changing lesions.  LENTIGINES, SEBORRHEIC KERATOSES, HEMANGIOMAS - Benign normal skin lesions -  Benign-appearing - Call for any changes  MELANOCYTIC NEVI - Tan-brown and/or pink-flesh-colored symmetric macules and papules - 3 mm speckled brown papule at right upper arm. Stable. - 1 mm brown papule adjacent 3 mm light brown macule on left upper arm. Stable. Benign-appearing. Stable compared to previous visit. Observation.  Call clinic for new or changing moles.  Recommend daily use of broad spectrum spf 30+ sunscreen to sun-exposed areas.     DERMATOFIBROMA Exam: 4 mm firm pink/brown papulenodule with dimple sign at right lower calf. Treatment Plan: A dermatofibroma is a benign growth possibly related to trauma, such as an insect bite, cut from shaving, or inflamed acne-type bump.  Treatment options to remove include shave or excision with resulting scar and risk of recurrence.  Since benign-appearing and not bothersome, will observe for now.   SEBORRHEIC KERATOSIS - Stuck-on, waxy, tan-brown papules at right calf  - Benign-appearing - Discussed benign etiology and prognosis. 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 Condition is improving with treatment but not currently at goal.   Treatment Plan: Continue Ciclopirox  solution every night at bedtime, remove 7th night, continue cycle     AK (ACTINIC KERATOSIS) (2) Right Forehead x2 (2) Actinic keratoses are precancerous spots that appear secondary to cumulative UV radiation exposure/sun exposure over time. They are chronic with expected duration over 1 year. A portion of actinic keratoses will progress to squamous cell carcinoma of the skin. It is not possible to reliably predict which spots will progress to skin cancer and so treatment is  recommended to prevent development of skin cancer.  Recommend daily broad spectrum sunscreen SPF 30+ to sun-exposed areas, reapply every 2 hours as needed.   Recommend staying in the shade or wearing long sleeves, sun glasses (UVA+UVB protection) and wide brim hats (4-inch brim around the entire circumference of the hat). Call for new or changing lesions. Destruction of lesion - Right Forehead x2 (2)  Destruction method: cryotherapy   Informed consent: discussed and consent obtained   Lesion destroyed using liquid nitrogen: Yes   Region frozen until ice ball extended beyond lesion: Yes   Outcome: patient tolerated procedure well with no complications   Post-procedure details: wound care instructions given   Additional details:  Prior to procedure, discussed risks of blister formation, small wound, skin dyspigmentation, or rare scar following cryotherapy. Recommend Vaseline ointment to treated areas while healing.  TINEA UNGUIUM   Return in about 1 year (around 12/13/2024) for TBSE, HxAK.  I, Jill Parcell, CMA, am acting as scribe for Artemio Larry, MD.   Documentation: I have reviewed the above documentation for accuracy and completeness, and I agree with the above.  Artemio Larry, MD

## 2023-12-14 NOTE — Patient Instructions (Addendum)
 Cryotherapy Aftercare  Wash gently with soap and water everyday.   Apply Vaseline Jelly daily until healed.   Continue Ciclopirox  at bedtime to nail and surrounding skin. Apply daily over previous coat. After seven (7) days, may remove with alcohol and continue cycle.   Recommend daily broad spectrum sunscreen SPF 30+ to sun-exposed areas, reapply every 2 hours as needed. Call for new or changing lesions.  Staying in the shade or wearing long sleeves, sun glasses (UVA+UVB protection) and wide brim hats (4-inch brim around the entire circumference of the hat) are also recommended for sun protection.     Melanoma ABCDEs  Melanoma is the most dangerous type of skin cancer, and is the leading cause of death from skin disease.  You are more likely to develop melanoma if you: Have light-colored skin, light-colored eyes, or red or blond hair Spend a lot of time in the sun Tan regularly, either outdoors or in a tanning bed Have had blistering sunburns, especially during childhood Have a close family member who has had a melanoma Have atypical moles or large birthmarks  Early detection of melanoma is key since treatment is typically straightforward and cure rates are extremely high if we catch it early.   The first sign of melanoma is often a change in a mole or a new dark spot.  The ABCDE system is a way of remembering the signs of melanoma.  A for asymmetry:  The two halves do not match. B for border:  The edges of the growth are irregular. C for color:  A mixture of colors are present instead of an even brown color. D for diameter:  Melanomas are usually (but not always) greater than 6mm - the size of a pencil eraser. E for evolution:  The spot keeps changing in size, shape, and color.  Please check your skin once per month between visits. You can use a small mirror in front and a large mirror behind you to keep an eye on the back side or your body.   If you see any new or changing  lesions before your next follow-up, please call to schedule a visit.  Please continue daily skin protection including broad spectrum sunscreen SPF 30+ to sun-exposed areas, reapplying every 2 hours as needed when you're outdoors.   Staying in the shade or wearing long sleeves, sun glasses (UVA+UVB protection) and wide brim hats (4-inch brim around the entire circumference of the hat) are also recommended for sun protection.      Due to recent changes in healthcare laws, you may see results of your pathology and/or laboratory studies on MyChart before the doctors have had a chance to review them. We understand that in some cases there may be results that are confusing or concerning to you. Please understand that not all results are received at the same time and often the doctors may need to interpret multiple results in order to provide you with the best plan of care or course of treatment. Therefore, we ask that you please give us  2 business days to thoroughly review all your results before contacting the office for clarification. Should we see a critical lab result, you will be contacted sooner.   If You Need Anything After Your Visit  If you have any questions or concerns for your doctor, please call our main line at [phone] and press option 4 to reach your doctor's medical assistant. If no one answers, please leave a voicemail as directed and we will return  your call as soon as possible. Messages left after 4 pm will be answered the following business day.   You may also send us  a message via MyChart. We typically respond to MyChart messages within 1-2 business days.  For prescription refills, please ask your pharmacy to contact our office. Our fax number is [phone].  If you have an urgent issue when the clinic is closed that cannot wait until the next business day, you can page your doctor at the number below.    Please note that while we do our best to be available for urgent  issues outside of office hours, we are not available 24/7.   If you have an urgent issue and are unable to reach us , you may choose to seek medical care at your doctor's office, retail clinic, urgent care center, or emergency room.  If you have a medical emergency, please immediately call 911 or go to the emergency department.  Pager Numbers  - Dr. Bary Likes: [phone]  - Dr. Annette Barters: [phone]  - Dr. Felipe Horton: [phone]   In the event of inclement weather, please call our main line at [phone] for an update on the status of any delays or closures.  Dermatology Medication Tips: Please keep the boxes that topical medications come in in order to help keep track of the instructions about where and how to use these. Pharmacies typically print the medication instructions only on the boxes and not directly on the medication tubes.   If your medication is too expensive, please contact our office at [phone] option 4 or send us  a message through MyChart.   We are unable to tell what your co-pay for medications will be in advance as this is different depending on your insurance coverage. However, we may be able to find a substitute medication at lower cost or fill out paperwork to get insurance to cover a needed medication.   If a prior authorization is required to get your medication covered by your insurance company, please allow us  1-2 business days to complete this process.  Drug prices often vary depending on where the prescription is filled and some pharmacies may offer cheaper prices.  The website www.goodrx.com contains coupons for medications through different pharmacies. The prices here do not account for what the cost may be with help from insurance (it may be cheaper with your insurance), but the website can give you the price if you did not use any insurance.  - You can print the associated coupon and take it with your prescription to the pharmacy.  - You may also stop  by our office during regular business hours and pick up a GoodRx coupon card.  - If you need your prescription sent electronically to a different pharmacy, notify our office through [facility] or by phone at [phone] option 4.     Si Usted Necesita Algo Despus de Su Visita  Tambin puede enviarnos un mensaje a travs de Clinical cytogeneticist. Por lo general respondemos a los mensajes de MyChart en el transcurso de 1 a 2 das hbiles.  Para renovar recetas, por favor pida a su farmacia que se ponga en contacto con nuestra oficina. Franz Jacks de fax es [place] [phone].  Si tiene un asunto urgente cuando la clnica est cerrada y que no puede esperar hasta el siguiente da hbil, puede llamar/localizar a su doctor(a) al nmero que aparece a continuacin.   Por favor, tenga en cuenta que aunque hacemos todo lo posible para estar disponibles para asuntos  urgentes fuera del horario de [place], no estamos disponibles las 24 horas del da, los 7 [address]  [address] de la [place].   Si tiene un problema urgente y no puede comunicarse con nosotros, puede optar por buscar atencin mdica  en el consultorio de su doctor(a), en una clnica privada, en un centro de atencin urgente o en una sala de emergencias.  Si tiene Engineer, drilling, por favor llame inmediatamente al 911 o vaya a la sala de emergencias.  Nmeros de bper  - Dr. Bary Likes: [phone]  - Dra. Annette Barters: [phone]  - Dr. Felipe Horton: [phone]   En caso de inclemencias del tiempo, por favor llame a Lajuan Pila principal al [phone] para una actualizacin sobre el [place] de cualquier retraso o cierre.  Consejos para la medicacin en dermatologa: Por favor, guarde las cajas en las que vienen los medicamentos de uso tpico para ayudarle a seguir las instrucciones sobre dnde y cmo usarlos. Las farmacias generalmente imprimen las instrucciones del medicamento slo en las cajas y no directamente en los tubos del [place].   Si su  medicamento es muy caro, por favor, pngase en contacto con Bettyjane Brunet llamando al [phone] y presione la opcin 4 o envenos un mensaje a travs de Clinical cytogeneticist.   No podemos decirle cul ser su copago por los medicamentos por adelantado ya que esto es diferente dependiendo de la cobertura de su seguro. Sin embargo, es posible que podamos encontrar un medicamento sustituto a Audiological scientist un formulario para que el seguro cubra el medicamento que se considera necesario.   Si se requiere una autorizacin previa para que su compaa de seguros [place] su medicamento, por favor permtanos de 1 a 2 das hbiles para completar este proceso.  Los precios de los medicamentos varan con frecuencia dependiendo del Environmental consultant de dnde se surte la receta y alguna farmacias pueden ofrecer precios ms baratos.  El sitio web www.goodrx.com tiene cupones para medicamentos de Health and safety inspector. Los precios aqu no tienen en cuenta lo que podra costar con la ayuda del seguro (puede ser ms barato con su seguro), pero el sitio web puede darle el precio si no utiliz Tourist information centre manager.  - Puede imprimir el cupn correspondiente y llevarlo con su receta a la farmacia.  - Tambin puede pasar por nuestra oficina durante el horario de atencin regular y Education officer, museum una tarjeta de cupones de GoodRx.  - Si necesita que su receta se enve electrnicamente a una farmacia diferente, informe a nuestra oficina a travs de MyChart de [place] o por telfono llamando al [phone] y presione la opcin 4.

## 2023-12-20 DIAGNOSIS — H43811 Vitreous degeneration, right eye: Secondary | ICD-10-CM | POA: Diagnosis not present

## 2024-05-24 ENCOUNTER — Ambulatory Visit (INDEPENDENT_AMBULATORY_CARE_PROVIDER_SITE_OTHER)

## 2024-05-24 DIAGNOSIS — L570 Actinic keratosis: Secondary | ICD-10-CM

## 2024-05-24 DIAGNOSIS — L578 Other skin changes due to chronic exposure to nonionizing radiation: Secondary | ICD-10-CM

## 2024-05-24 DIAGNOSIS — Z1330 Encounter for screening examination for mental health and behavioral disorders, unspecified: Secondary | ICD-10-CM | POA: Diagnosis not present

## 2024-05-24 DIAGNOSIS — E063 Autoimmune thyroiditis: Secondary | ICD-10-CM | POA: Diagnosis not present

## 2024-05-24 DIAGNOSIS — Z23 Encounter for immunization: Secondary | ICD-10-CM | POA: Diagnosis not present

## 2024-05-24 DIAGNOSIS — L820 Inflamed seborrheic keratosis: Secondary | ICD-10-CM

## 2024-05-24 DIAGNOSIS — L821 Other seborrheic keratosis: Secondary | ICD-10-CM | POA: Diagnosis not present

## 2024-05-24 DIAGNOSIS — D1801 Hemangioma of skin and subcutaneous tissue: Secondary | ICD-10-CM | POA: Diagnosis not present

## 2024-05-24 DIAGNOSIS — L814 Other melanin hyperpigmentation: Secondary | ICD-10-CM | POA: Diagnosis not present

## 2024-05-24 DIAGNOSIS — W908XXA Exposure to other nonionizing radiation, initial encounter: Secondary | ICD-10-CM

## 2024-05-24 DIAGNOSIS — Z1283 Encounter for screening for malignant neoplasm of skin: Secondary | ICD-10-CM

## 2024-05-24 NOTE — Progress Notes (Signed)
 Subjective   Stephanie Dillon is a 73 y.o. female who presents for the following: Lesion(s) of concern . Patient is established patient   Today patient reports: LOC at left forehead and chest, hx of Aks.   Review of Systems:    No other skin or systemic complaints except as noted in HPI or Assessment and Plan.  The following portions of the chart were reviewed this encounter and updated as appropriate: medications, allergies, medical history  Relevant Medical History:  Personal history of actinic keratosis   Objective  Well appearing patient in no apparent distress; mood and affect are within normal limits. Examination was performed of the: Sun Exposed Exam: Scalp, head, eyes, ears, nose, lips, neck, upper extremities, hands, fingers, fingernails  Examination notable for: Angioma(s): Scattered red vascular papule(s)  , Lentigo/lentigines: Scattered pigmented macules that are tan to brown in color and are somewhat non-uniform in shape and concentrated in the sun-exposed areas, Seborrheic Keratosis(es): Stuck-on appearing keratotic papule(s) on the trunk, some  irritated with redness, crusting, edema, and/or partial avulsion, Actinic Damage/Elastosis: chronic sun damage: dyspigmentation, telangiectasia, and wrinkling, Actinic keratosis: Scaly erythematous macule(s) concentrated on sun exposed areas   Examination limited by: Undergarments, Shoes or socks , Clothing, and Patient deferred removal     Nose x1, L temple x1 (2) Pink scaly macules Chest x1 Stuck on waxy paps with erythema  Assessment & Plan    Benign Lesions/ Findings:  -Seborrheic Keratosis  -Lentigo's  - Angiomas  - Reassurance provided regarding the benign appearance of lesions noted on exam today; no treatment is indicated in the absence of symptoms/changes. - Reinforced importance of photoprotective strategies including liberal and frequent sunscreen use of a broad-spectrum SPF 30 or greater, use of protective  clothing, and sun avoidance for prevention of cutaneous malignancy and photoaging.  Counseled patient on the importance of regular self-skin monitoring as well as routine clinical skin examinations as scheduled.   ACTINIC DAMAGE - Chronic condition, secondary to cumulative UV/sun exposure - Recommend daily broad spectrum sunscreen SPF 30+ to sun-exposed areas, reapply every 2 hours as needed.  - Staying in the shade or wearing long sleeves, sun glasses (UVA+UVB protection) and wide brim hats (4-inch brim around the entire circumference of the hat) are also recommended for sun protection.  - Call for new or changing lesions.  Level of service outlined above   Procedures, orders, diagnosis for this visit:  AK (ACTINIC KERATOSIS) (2) Nose x1, L temple x1 (2) Actinic keratoses are precancerous spots that appear secondary to cumulative UV radiation exposure/sun exposure over time. They are chronic with expected duration over 1 year. A portion of actinic keratoses will progress to squamous cell carcinoma of the skin. It is not possible to reliably predict which spots will progress to skin cancer and so treatment is recommended to prevent development of skin cancer.  Recommend daily broad spectrum sunscreen SPF 30+ to sun-exposed areas, reapply every 2 hours as needed.  Recommend staying in the shade or wearing long sleeves, sun glasses (UVA+UVB protection) and wide brim hats (4-inch brim around the entire circumference of the hat). Call for new or changing lesions. Destruction of lesion - Nose x1, L temple x1 (2) Complexity: simple   Destruction method: cryotherapy   Informed consent: discussed and consent obtained   Timeout:  patient name, date of birth, surgical site, and procedure verified Lesion destroyed using liquid nitrogen: Yes   Region frozen until ice ball extended beyond lesion: Yes   Cryo cycles:  1 or 2. Outcome: patient tolerated procedure well with no complications   Post-procedure  details: wound care instructions given   Additional details:  Prior to procedure, discussed risks of blister formation, small wound, skin dyspigmentation, or rare scar following cryotherapy. Recommend Vaseline ointment to treated areas while healing.   INFLAMED SEBORRHEIC KERATOSIS Chest x1 Symptomatic, irritating, patient would like treated. Destruction of lesion - Chest x1 Complexity: simple   Destruction method: cryotherapy   Informed consent: discussed and consent obtained   Timeout:  patient name, date of birth, surgical site, and procedure verified Lesion destroyed using liquid nitrogen: Yes   Region frozen until ice ball extended beyond lesion: Yes   Cryo cycles: 1 or 2. Outcome: patient tolerated procedure well with no complications   Post-procedure details: wound care instructions given   Additional details:  Prior to procedure, discussed risks of blister formation, small wound, skin dyspigmentation, or rare scar following cryotherapy. Recommend Vaseline ointment to treated areas while healing.    AK (actinic keratosis) -     Destruction of lesion  Inflamed seborrheic keratosis -     Destruction of lesion    Return to clinic: Return for As scheduled, w/ Dr. Jackquline, TBSE.  I, [name], CMA, am acting as scribe for Stephanie JAYSON Kanaris, MD.  Documentation: I have reviewed the above documentation for accuracy and completeness, and I agree with the above.  Stephanie JAYSON Kanaris, MD

## 2024-05-24 NOTE — Patient Instructions (Addendum)
 Cryosurgery  Cryosurgery ("freezing") uses liquid nitrogen to destroy certain types of skin lesions. Lowering the temperature of the lesion in a small area surrounding skin destroys the lesion. Immediately following cryosurgery, you will notice redness and swelling of the treatment area. Blistering or weeping may occur, lasting approximately one week which will then be followed by crusting. Most areas will heal completely in 10 to 14 days.  Wash the treated areas daily. Allow soap and water to run over the areas, but do not scrub. Should a scab or crust form, allow it to fall off on its own. Do not remove or pick at it. Application of an ointment  and a bandage may make you feel more comfortable, but it is not necessary. Some people develop an allergy to Neosporin, so we recommend that Vaseline or  Aquaphor be used.  The cryotherapy site will be more sensitive than your surrounding skin. Keep it covered, and remember to apply sunscreen every day to all your sun exposed skin. A scar may remain which is lighter or pinker than your normal skin. Your body will continue to improve your scar for up to one year; however a light-colored scar may remain.  Infection following cryotherapy is rare. However if you are worried about the appearance of the treated area, contact your doctor. We have a physician on call at all times. If you have any concerns about the site, please call our clinic at [phone]   Sunscreen  Who needs sunscreen? Everyone. Sunscreen use can help prevent skin cancer by protecting you from the sun's harmful ultraviolet rays. Anyone can get skin cancer, regardless of age, gender or race. In fact, it is estimated that one in five Americans will develop skin cancer in their lifetime.  Sunscreen alone cannot fully protect you. In addition to wearing sunscreen, dermatologists recommend taking the following steps to protect your skin and find skin cancer early:  Seek shade when appropriate,  remembering that the sun's rays are strongest between 10 a.m. and 2 p.m. If your shadow is shorter than you are, seek shade. Dress to protect yourself from the sun by wearing a lightweight long-sleeved shirt, pants, a wide-brimmed hat and sunglasses, when possible.  Use extra caution near water, snow and sand as they reflect the damaging rays of the sun, which can increase your chance of sunburn.  Get vitamin D  safely through a healthy diet that may include vitamin supplements. Don't seek the sun. Avoid tanning beds. Ultraviolet light from the sun and tanning beds can cause skin cancer and wrinkling. If you want to look tan, you may wish to use a self-tanning product, but continue to use sunscreen with it.  When should I use sunscreen? Every day you go outside--even if you're just walking to and from your form of transportation. The sun emits harmful UV rays year-round. Even on cloudy days, up to 80 percent of the sun's harmful UV rays can penetrate your skin. Snow, sand and water increase the need for sunscreen because they reflect the sun's rays.  How much sunscreen should I use, and how often should I apply it? Most people only apply 25-50 percent of the recommended amount of sunscreen. Apply enough sunscreen to cover all exposed skin. Most adults need about 1 ounce -- or enough to fill a shot glass -- to fully cover their body.  Don't forget to apply to the tops of your feet, your neck, your ears and the top of your head. Apply sunscreen to dry skin  15 minutes before going outdoors.  Skin cancer also can form on the lips. To protect your lips, apply a lip balm or lipstick that contains sunscreen with an SPF of 30 or higher.  When outdoors, reapply sunscreen approximately every two hours, or after swimming or sweating, according to the directions on the bottle.   Broad-spectrum sunscreens protect against both UVA and UVB rays. What is the difference between the rays? Sunlight consists of two types  of harmful rays that reach the earth -- UVA rays and UVB rays. Overexposure to either can lead to skin cancer. In addition to causing skin cancer, here's what each of these rays do:  UVA rays (or aging rays) can prematurely age your skin, causing wrinkles and age spots, and can pass through window glass. UVB rays (or burning rays) are the primary cause of sunburn and are blocked by window glass  There is no safe way to tan. Every time you tan, you damage your skin. As this damage builds, you speed up the aging of your skin and increase your risk for all types of skin cancer.  What is the difference between chemical and physical sunscreens? Chemical sunscreens work like a sponge, absorbing the sun's rays. They contain one or more of the following active ingredients: oxybenzone, avobenzone, octisalate, octocrylene, homosalate and octinoxate. These formulations tend to be easier to rub into the skin without leaving a white residue.   Physical sunscreens work like a shield, sitting sit on the surface of your skin and deflecting the sun's rays. They contain the active ingredients zinc oxide and/or titanium dioxide. Use this sunscreen if you have sensitive skin.   What type of sunscreen should I use? The best type of sunscreen is the one you will use again and again. Just make sure it offers broad-spectrum (UVA and UVB) protection, has an SPF of 30+, and is water-resistant. The kind of sunscreen you use is a matter of personal choice, and may vary depending on the area of the body to be protected. Available sunscreen options include lotions, creams, gels, ointments, wax sticks and sprays.  Recommended physical sunscreens for face: - Neutrogena Sheer Zinc - Aveeno Positively Mineral Sensitive - CeraVe Hydrating Mineral (also has a tinted version) - La Roche-Posay Anthelios Mineral Face (comes as a cream, lotion, light fluid, and there is also a tinted version).  - EltaMD UV Clear (also has a tinted  version)  Recommended physical sunscreens for body: - Neutrogena Sheer Zinc Dry-Touch Sunscreen Sensitive Skin Lotion Broad Spectrum SPF 50 - Aveeno Positively Mineral Sensitive Skin Sunscreen Broad Spectrum SPF 50 - La Roche-Posay Anthelios SPF 50 Mineral Sunscreen - Gentle Lotion - CeraVe Hydrating Mineral Sunscreen SPF 50  Recommended chemical sunscreens for face: - Anthelios UV Correct Face Sunscreen SPF 70 with Niacinamide - Neutrogena Clear Face Oil-Free SPF 50 with Helioplex - Neutrogena Sport Face Oil-Free SPF 70+ with Helioplex - Aveeno Protect + Hydrate Sunscreen For Face SPF 70 - La Roche-Posay Anthelios Light Fluid Sunscreen for Face SPF 60  Recommended chemical sunscreens for body: - Neutrogena Ultra Sheer Dry-Touch Sunscreen SPF 70 - Aveeno Protect + Hydrate Broad Spectrum All-Day Hydration SPF 60 (comes in a big pump) - La Roche-Posay Anthelios Melt-In Milk Sunscreen SPF 60    Due to recent changes in healthcare laws, you may see results of your pathology and/or laboratory studies on MyChart before the doctors have had a chance to review them. We understand that in some cases there may be results that  are confusing or concerning to you. Please understand that not all results are received at the same time and often the doctors may need to interpret multiple results in order to provide you with the best plan of care or course of treatment. Therefore, we ask that you please give us  2 business days to thoroughly review all your results before contacting the office for clarification. Should we see a critical lab result, you will be contacted sooner.   If You Need Anything After Your Visit  If you have any questions or concerns for your doctor, please call our main line at [phone] and press option 4 to reach your doctor's medical assistant. If no one answers, please leave a voicemail as directed and we will return your call as soon as possible. Messages left after 4 pm will  be answered the following business day.   You may also send us  a message via MyChart. We typically respond to MyChart messages within 1-2 business days.  For prescription refills, please ask your pharmacy to contact our office. Our fax number is [phone].  If you have an urgent issue when the clinic is closed that cannot wait until the next business day, you can page your doctor at the number below.    Please note that while we do our best to be available for urgent issues outside of office hours, we are not available 24/7.   If you have an urgent issue and are unable to reach us , you may choose to seek medical care at your doctor's office, retail clinic, urgent care center, or emergency room.  If you have a medical emergency, please immediately call 911 or go to the emergency department.  Pager Numbers  - Dr. Hester: [phone]  - Dr. Jackquline: [phone]  - Dr. Claudene: [phone]   In the event of inclement weather, please call our main line at [phone] for an update on the status of any delays or closures.  Dermatology Medication Tips: Please keep the boxes that topical medications come in in order to help keep track of the instructions about where and how to use these. Pharmacies typically print the medication instructions only on the boxes and not directly on the medication tubes.   If your medication is too expensive, please contact our office at [phone] option 4 or send us  a message through MyChart.   We are unable to tell what your co-pay for medications will be in advance as this is different depending on your insurance coverage. However, we may be able to find a substitute medication at lower cost or fill out paperwork to get insurance to cover a needed medication.   If a prior authorization is required to get your medication covered by your insurance company, please allow us  1-2 business days to complete this process.  Drug prices often vary depending on  where the prescription is filled and some pharmacies may offer cheaper prices.  The website www.goodrx.com contains coupons for medications through different pharmacies. The prices here do not account for what the cost may be with help from insurance (it may be cheaper with your insurance), but the website can give you the price if you did not use any insurance.  - You can print the associated coupon and take it with your prescription to the pharmacy.  - You may also stop by our office during regular business hours and pick up a GoodRx coupon card.  - If you need your prescription sent electronically to a different pharmacy,  notify our office through [facility] or by phone at [phone] option 4.     Si Usted Necesita Algo Despus de Su Visita  Tambin puede enviarnos un mensaje a travs de Clinical cytogeneticist. Por lo general respondemos a los mensajes de MyChart en el transcurso de 1 a 2 das hbiles.  Para renovar recetas, por favor pida a su farmacia que se ponga en contacto con nuestra oficina. Randi lakes de fax es [place] [phone].  Si tiene un asunto urgente cuando la clnica est cerrada y que no puede esperar hasta el siguiente da hbil, puede llamar/localizar a su doctor(a) al nmero que aparece a continuacin.   Por favor, tenga en cuenta que aunque hacemos todo lo posible para estar disponibles para asuntos urgentes fuera del horario de [place], no estamos disponibles las 24 horas del da, los 7 [address]  [address] de la [place].   Si tiene un problema urgente y no puede comunicarse con nosotros, puede optar por buscar atencin mdica  en el consultorio de su doctor(a), en una clnica privada, en un centro de atencin urgente o en una sala de emergencias.  Si tiene Engineer, drilling, por favor llame inmediatamente al 911 o vaya a la sala de emergencias.  Nmeros de bper  - Dr. Hester: [phone]  - Dra. Jackquline: [phone]  - Dr. Claudene: [phone]   En caso de inclemencias  del tiempo, por favor llame a landry capes principal al [phone] para una actualizacin sobre el [place] de cualquier retraso o cierre.  Consejos para la medicacin en dermatologa: Por favor, guarde las cajas en las que vienen los medicamentos de uso tpico para ayudarle a seguir las instrucciones sobre dnde y cmo usarlos. Las farmacias generalmente imprimen las instrucciones del medicamento slo en las cajas y no directamente en los tubos del [place].   Si su medicamento es muy caro, por favor, pngase en contacto con landry rieger llamando al [phone] y presione la opcin 4 o envenos un mensaje a travs de Clinical cytogeneticist.   No podemos decirle cul ser su copago por los medicamentos por adelantado ya que esto es diferente dependiendo de la cobertura de su seguro. Sin embargo, es posible que podamos encontrar un medicamento sustituto a Audiological scientist un formulario para que el seguro cubra el medicamento que se considera necesario.   Si se requiere una autorizacin previa para que su compaa de seguros malta su medicamento, por favor permtanos de 1 a 2 das hbiles para completar este proceso.  Los precios de los medicamentos varan con frecuencia dependiendo del Environmental consultant de dnde se surte la receta y alguna farmacias pueden ofrecer precios ms baratos.  El sitio web www.goodrx.com tiene cupones para medicamentos de Health and safety inspector. Los precios aqu no tienen en cuenta lo que podra costar con la ayuda del seguro (puede ser ms barato con su seguro), pero el sitio web puede darle el precio si no utiliz Tourist information centre manager.  - Puede imprimir el cupn correspondiente y llevarlo con su receta a la farmacia.  - Tambin puede pasar por nuestra oficina durante el horario de atencin regular y Education officer, museum una tarjeta de cupones de GoodRx.  - Si necesita que su receta se enve electrnicamente a una farmacia diferente, informe a nuestra oficina a travs de MyChart de [place] o por telfono  llamando al [phone] y presione la opcin 4.

## 2024-08-09 ENCOUNTER — Other Ambulatory Visit: Payer: Self-pay | Admitting: Nurse Practitioner

## 2024-08-09 DIAGNOSIS — Z1231 Encounter for screening mammogram for malignant neoplasm of breast: Secondary | ICD-10-CM

## 2024-08-24 ENCOUNTER — Ambulatory Visit (INDEPENDENT_AMBULATORY_CARE_PROVIDER_SITE_OTHER): Payer: Medicare Other | Admitting: Nurse Practitioner

## 2024-08-24 ENCOUNTER — Encounter: Payer: Self-pay | Admitting: Nurse Practitioner

## 2024-08-24 VITALS — BP 128/80 | HR 66 | Temp 97.0°F | Resp 16 | Ht 70.0 in | Wt 136.8 lb

## 2024-08-24 DIAGNOSIS — E871 Hypo-osmolality and hyponatremia: Secondary | ICD-10-CM

## 2024-08-24 DIAGNOSIS — K760 Fatty (change of) liver, not elsewhere classified: Secondary | ICD-10-CM | POA: Diagnosis not present

## 2024-08-24 DIAGNOSIS — R300 Dysuria: Secondary | ICD-10-CM

## 2024-08-24 DIAGNOSIS — Z0000 Encounter for general adult medical examination without abnormal findings: Secondary | ICD-10-CM

## 2024-08-24 DIAGNOSIS — E063 Autoimmune thyroiditis: Secondary | ICD-10-CM | POA: Diagnosis not present

## 2024-08-24 DIAGNOSIS — E538 Deficiency of other specified B group vitamins: Secondary | ICD-10-CM | POA: Diagnosis not present

## 2024-08-24 DIAGNOSIS — Z1211 Encounter for screening for malignant neoplasm of colon: Secondary | ICD-10-CM

## 2024-08-24 DIAGNOSIS — E559 Vitamin D deficiency, unspecified: Secondary | ICD-10-CM

## 2024-08-24 DIAGNOSIS — M816 Localized osteoporosis [Lequesne]: Secondary | ICD-10-CM | POA: Diagnosis not present

## 2024-08-24 DIAGNOSIS — E782 Mixed hyperlipidemia: Secondary | ICD-10-CM | POA: Diagnosis not present

## 2024-08-24 NOTE — Addendum Note (Signed)
 Addended by: ALMER BI on: 08/24/2024 10:51 AM   Modules accepted: Orders

## 2024-08-24 NOTE — Progress Notes (Signed)
 [facility] [address] [place], [place] [address]  Internal MEDICINE  Office Visit Note  Patient Name: Stephanie Dillon  [id number]  [id number]  Date of Service: 08/24/2024  Chief Complaint  Patient presents with   Medicare Wellness    HPI Stephanie Dillon presents for a medicare annual wellness visit.  Well-appearing 74 y.o. female with osteoporosis and bladder prolapse, NAFLD, hemangioma of liver, and hashimoto's disease.  Routine CRC screening: due in April 2026 for colonoscopy Routine mammogram: due for routine mammogram in February  DEXA scan: done in 2024, osteopenia of spine, osteoporosis of left forearm and osteopenia of right femur neck. Can repeat dexa scan in march Labs: due for routine labs  New or worsening pain: none  Other concerns: none      08/24/2024    9:49 AM 08/24/2023   11:01 AM 08/10/2022    9:51 AM  MMSE - Mini Mental State Exam  Orientation to time 5 5 5   Orientation to Place 5 5 5   Registration 3 3 3   Attention/ Calculation 5 5 5   Recall 3 3 3   Language- name 2 objects 2 2 2   Language- repeat 1 1 1   Language- follow 3 step command 3 3 3   Language- read & follow direction 1 1 1   Write a sentence 1 1 1   Copy design 1 1 1   Total score 30 30 30     Functional Status Survey: Is the patient deaf or have difficulty hearing?: No Does the patient have difficulty seeing, even when wearing glasses/contacts?: No Does the patient have difficulty concentrating, remembering, or making decisions?: No Does the patient have difficulty walking or climbing stairs?: No Does the patient have difficulty dressing or bathing?: No Does the patient have difficulty doing errands alone such as visiting a doctor's office or shopping?: No     08/07/2021   11:09 AM 08/10/2022    9:49 AM 11/03/2022    9:42 AM 08/24/2023   11:00 AM 08/24/2024    9:48 AM  Fall Risk  Falls in the past year? 1 0 0 0 0  Was there an injury with Fall? 0  0  0  0  0  Fall Risk Category Calculator  1 0 0 0 0  Fall Risk Category (Retired) Low  Low      (RETIRED) Patient Fall Risk Level Low fall risk  Low fall risk      Patient at Risk for Falls Due to  No Fall Risks No Fall Risks No Fall Risks   Fall risk Follow up Falls evaluation completed  Falls evaluation completed  Falls evaluation completed Falls evaluation completed Falls evaluation completed     Data saved with a previous flowsheet row definition       08/24/2024    9:48 AM  Depression screen PHQ 2/9  Decreased Interest 0  Down, Depressed, Hopeless 0  PHQ - 2 Score 0        Current Medication: Outpatient Encounter Medications as of 08/24/2024  Medication Sig   Biotin 1 MG CAPS Take 1 tablet by mouth daily.   calcium citrate-vitamin D  (CITRACAL+D) 315-200 MG-UNIT per tablet Take 1 tablet by mouth daily.   ciclopirox  (PENLAC ) 8 % solution Apply topically at bedtime. Apply over nail and surrounding skin. Apply daily over previous coat. After seven (7) days, may remove with alcohol and continue cycle.   Cyanocobalamin  (B-12) 250 MCG TABS Take 250 mcg by mouth daily.   levothyroxine (SYNTHROID, LEVOTHROID) 50 MCG tablet Take  50 mcg by mouth daily before breakfast.   [DISCONTINUED] folic acid  (FOLVITE ) 400 MCG tablet Take 1 tablet by mouth daily. (Patient not taking: Reported on 08/24/2024)   [DISCONTINUED] Multiple Vitamin (MULTIVITAMIN) tablet Take 1 tablet by mouth daily. (Patient not taking: Reported on 08/24/2024)   [DISCONTINUED] vitamin C (ASCORBIC ACID) 500 MG tablet Take 500 mg by mouth daily. (Patient not taking: Reported on 08/24/2024)   No facility-administered encounter medications on file as of 08/24/2024.    Surgical History: Past Surgical History:  Procedure Laterality Date   TUBAL LIGATION      Medical History: Past Medical History:  Diagnosis Date   Actinic keratosis    Hypothyroid    Thyroid  disease     Family History: Family History  Problem Relation Age of Onset   Colon cancer Mother    AAA  (abdominal aortic aneurysm) Mother    Breast cancer Neg Hx     Social History   Socioeconomic History   Marital status: Married    Spouse name: Not on file   Number of children: Not on file   Years of education: Not on file   Highest education level: Not on file  Occupational History   Not on file  Tobacco Use   Smoking status: Never   Smokeless tobacco: Never   Tobacco comments:    only tried in high school   Vaping Use   Vaping status: Never Used  Substance and Sexual Activity   Alcohol use: No   Drug use: No   Sexual activity: Not on file  Other Topics Concern   Not on file  Social History Narrative   Not on file   Social Drivers of Health   Tobacco Use: Low Risk (08/24/2024)   Patient History    Smoking Tobacco Use: Never    Smokeless Tobacco Use: Never    Passive Exposure: Not on file  Financial Resource Strain: Not on file  Food Insecurity: No Food Insecurity (05/19/2022)   Received [facility]   Epic    Within the past 12 months, you worried that your food would run out before you got the money to buy more.: Never true    Within the past 12 months, the food you bought just didn't last and you didn't have money to get more.: Never true  Transportation Needs: Not on file  Physical Activity: Not on file  Stress: Not on file  Social Connections: Not on file  Intimate Partner Violence: Not At Risk (10/20/2022)   Received from [facility]   Epic    Within the last year, have you been afraid of your partner or ex-partner?: No    Within the last year, have you been humiliated or emotionally abused in other ways by your partner or ex-partner?: No    Within the last year, have you been kicked, hit, slapped, or otherwise physically hurt by your partner or ex-partner?: No    Within the last year, have you been raped or forced to have any kind of sexual activity by your partner or ex-partner?: No  Depression (PHQ2-9): Low Risk (08/24/2024)   Depression  (PHQ2-9)    PHQ-2 Score: 0  Alcohol Screen: Low Risk (08/07/2021)   Alcohol Screen    Last Alcohol Screening Score (AUDIT): 0  Housing: Not on file  Utilities: Not on file  Health Literacy: Not on file      Review of Systems  Constitutional:  Negative for activity change, appetite change, chills,  fatigue, fever and unexpected weight change.  HENT: Negative.  Negative for congestion, ear pain, rhinorrhea, sore throat and trouble swallowing.   Eyes: Negative.   Respiratory: Negative.  Negative for cough, chest tightness, shortness of breath and wheezing.   Cardiovascular: Negative.  Negative for chest pain.  Gastrointestinal: Negative.  Negative for abdominal pain, blood in stool, constipation, diarrhea, nausea and vomiting.  Endocrine: Negative.   Genitourinary: Negative.  Negative for difficulty urinating, dysuria, frequency, hematuria and urgency.  Musculoskeletal: Negative.  Negative for arthralgias, back pain, joint swelling, myalgias and neck pain.  Skin: Negative.  Negative for rash and wound.  Allergic/Immunologic: Negative.  Negative for immunocompromised state.  Neurological: Negative.  Negative for dizziness, seizures, numbness and headaches.  Hematological: Negative.   Psychiatric/Behavioral: Negative.  Negative for behavioral problems, self-injury and suicidal ideas. The patient is not nervous/anxious.     Vital Signs: BP 128/80   Pulse 66   Temp (!) 97 F (36.1 C)   Resp 16   Ht 5' 10 (1.778 m)   Wt 136 lb 12.8 oz (62.1 kg)   SpO2 98%   BMI 19.63 kg/m    Physical Exam Vitals reviewed.  Constitutional:      General: She is awake. She is not in acute distress.    Appearance: Normal appearance. She is well-developed and well-groomed. She is obese. She is not ill-appearing or diaphoretic.  HENT:     Head: Normocephalic and atraumatic.     Right Ear: Tympanic membrane, ear canal and external ear normal.     Left Ear: Tympanic membrane, ear canal and external  ear normal.     Nose: Nose normal. No congestion or rhinorrhea.     Mouth/Throat:     Lips: Pink.     Mouth: Mucous membranes are moist.     Pharynx: Oropharynx is clear. Uvula midline. No oropharyngeal exudate or posterior oropharyngeal erythema.  Eyes:     General: Lids are normal. Vision grossly intact. Gaze aligned appropriately. No scleral icterus.       Right eye: No discharge.        Left eye: No discharge.     Extraocular Movements: Extraocular movements intact.     Conjunctiva/sclera: Conjunctivae normal.     Pupils: Pupils are equal, round, and reactive to light.     Funduscopic exam:    Right eye: Red reflex present.        Left eye: Red reflex present. Neck:     Thyroid : No thyromegaly.     Vascular: No JVD.     Trachea: Trachea and phonation normal. No tracheal deviation.  Cardiovascular:     Rate and Rhythm: Normal rate and regular rhythm.     Pulses: Normal pulses.     Heart sounds: Normal heart sounds, S1 normal and S2 normal. No murmur heard.    No friction rub. No gallop.  Pulmonary:     Effort: Pulmonary effort is normal. No accessory muscle usage or respiratory distress.     Breath sounds: Normal breath sounds and air entry. No stridor. No wheezing or rales.  Chest:     Chest wall: No tenderness.     Comments: Declined clinical breast exam, patient will get mammogram done.  Abdominal:     General: Bowel sounds are normal. There is no distension.     Palpations: Abdomen is soft. There is no shifting dullness, fluid wave, mass or pulsatile mass.     Tenderness: There is no abdominal tenderness. There is  no guarding or rebound.  Musculoskeletal:        General: No tenderness or deformity. Normal range of motion.     Cervical back: Normal range of motion and neck supple.     Right lower leg: No edema.     Left lower leg: No edema.  Lymphadenopathy:     Cervical: No cervical adenopathy.  Skin:    General: Skin is warm and dry.     Capillary Refill: Capillary  refill takes less than 2 seconds.     Coloration: Skin is not pale.     Findings: No erythema or rash.  Neurological:     Mental Status: She is alert and oriented to person, place, and time.     Cranial Nerves: No cranial nerve deficit.     Motor: No abnormal muscle tone.     Coordination: Coordination normal.     Gait: Gait normal.     Deep Tendon Reflexes: Reflexes are normal and symmetric.  Psychiatric:        Mood and Affect: Mood normal.        Behavior: Behavior normal. Behavior is cooperative.        Thought Content: Thought content normal.        Judgment: Judgment normal.        Assessment/Plan: 1. Encounter for subsequent annual wellness visit (AWV) in Medicare patient (Primary) Age-appropriate preventive screenings and vaccinations discussed. Routine labs for health maintenance ordered, see below. PHM updated.   - CBC with Differential/Platelet - CMP14+EGFR - Lipid Profile - B12 and Folate Panel - Vitamin D  (25 hydroxy)  2. NAFLD (nonalcoholic fatty liver disease) Routine labs ordered  - CBC with Differential/Platelet - CMP14+EGFR - Lipid Profile - B12 and Folate Panel - Vitamin D  (25 hydroxy)  3. Hashimoto's disease Managed by endocrinology, seen annually in October and her labs were in range   4. Localized osteoporosis without current pathological fracture Dexa scan ordered, due in march - DG Bone Density; Future  5. Mixed hyperlipidemia Routine labs ordered  - CBC with Differential/Platelet - Lipid Profile  6. Hyponatremia Routine lab ordered  - CMP14+EGFR  7. B12 deficiency Routine lab ordered  - B12 and Folate Panel  8. Vitamin D  deficiency Routine lab ordered  - Vitamin D  (25 hydroxy)  9. Screening for colon cancer Referred to Dr. Aundria at [facility] GI for routine colonoscopy  - Ambulatory referral to Gastroenterology     General Counseling: Johnnette verbalizes understanding of the findings of todays visit and agrees with  plan of treatment. I have discussed any further diagnostic evaluation that may be needed or ordered today. We also reviewed her medications today. she has been encouraged to call the office with any questions or concerns that should arise related to todays visit.    Orders Placed This Encounter  Procedures   DG Bone Density   CBC with Differential/Platelet   CMP14+EGFR   Lipid Profile   B12 and Folate Panel   Vitamin D  (25 hydroxy)   Ambulatory referral to Gastroenterology    No orders of the defined types were placed in this encounter.   Return in about 1 year (around 08/24/2025) for AWV, Abdishakur Gottschall PCP and otherwise as needed. patient prefers phone call for labs. .   Total time spent:30 Minutes Time spent includes review of chart, medications, test results, and follow up plan with the patient.   [place] Controlled Substance Database was reviewed by me.  This patient was seen by Hermenegildo Clausen  Saidi Santacroce, FNP-C in collaboration with Dr. Sigrid Bathe as a part of collaborative care agreement.  Fleda Pagel R. Liana, MSN, FNP-C Internal medicine

## 2024-08-29 ENCOUNTER — Telehealth: Payer: Self-pay | Admitting: Nurse Practitioner

## 2024-08-29 NOTE — Telephone Encounter (Signed)
 Gastroenterology referral sent via [facility] to Dr. Aundria w/ [facility] Telephone # [phone]. Left patient vm-Toni

## 2024-08-30 LAB — UA/M W/RFLX CULTURE, ROUTINE
Bilirubin, UA: NEGATIVE
Glucose, UA: NEGATIVE
Ketones, UA: NEGATIVE
Nitrite, UA: NEGATIVE
Protein,UA: NEGATIVE
RBC, UA: NEGATIVE
Specific Gravity, UA: 1.008 (ref 1.005–1.030)
Urobilinogen, Ur: 0.2 mg/dL (ref 0.2–1.0)
pH, UA: 7 (ref 5.0–7.5)

## 2024-08-30 LAB — URINE CULTURE, REFLEX

## 2024-08-30 LAB — MICROSCOPIC EXAMINATION
Casts: NONE SEEN /LPF
Epithelial Cells (non renal): NONE SEEN /HPF (ref 0–10)
WBC, UA: NONE SEEN /HPF (ref 0–5)

## 2024-08-31 ENCOUNTER — Ambulatory Visit: Payer: Self-pay | Admitting: Nurse Practitioner

## 2024-08-31 DIAGNOSIS — N390 Urinary tract infection, site not specified: Secondary | ICD-10-CM

## 2024-08-31 MED ORDER — CIPROFLOXACIN HCL 500 MG PO TABS: 500.0000 mg | ORAL_TABLET | Freq: Two times a day (BID) | ORAL | 0 refills | Status: AC

## 2024-08-31 NOTE — Progress Notes (Signed)
 Patient has a UTI per her urine culture. . I have prescribed the antibiotic ciprofloxacin  twice daily for 5 days. She can take it with food.

## 2024-08-31 NOTE — Telephone Encounter (Signed)
Left patient a message to give office a callback.Marland Kitchen

## 2024-08-31 NOTE — Progress Notes (Signed)
 Pt notified UA culture  showed UTI we sent cipro  for 5 days

## 2024-09-19 ENCOUNTER — Encounter

## 2024-10-02 ENCOUNTER — Encounter

## 2024-10-02 ENCOUNTER — Other Ambulatory Visit

## 2025-01-09 ENCOUNTER — Encounter: Admitting: Dermatology

## 2025-08-27 ENCOUNTER — Ambulatory Visit: Admitting: Nurse Practitioner
